# Patient Record
Sex: Female | Born: 1970 | Race: White | Hispanic: No | Marital: Married | Smoking: Never smoker
Health system: Southern US, Community
[De-identification: ages and names within clinical notes are randomized; demographics above are authoritative.]

## PROBLEM LIST (undated history)

## (undated) DIAGNOSIS — E119 Type 2 diabetes mellitus without complications: Secondary | ICD-10-CM

## (undated) DIAGNOSIS — T7840XA Allergy, unspecified, initial encounter: Secondary | ICD-10-CM

## (undated) DIAGNOSIS — E079 Disorder of thyroid, unspecified: Secondary | ICD-10-CM

## (undated) DIAGNOSIS — G43909 Migraine, unspecified, not intractable, without status migrainosus: Secondary | ICD-10-CM

## (undated) DIAGNOSIS — K219 Gastro-esophageal reflux disease without esophagitis: Secondary | ICD-10-CM

## (undated) HISTORY — DX: Disorder of thyroid, unspecified: E07.9

## (undated) HISTORY — DX: Allergy, unspecified, initial encounter: T78.40XA

## (undated) HISTORY — DX: Migraine, unspecified, not intractable, without status migrainosus: G43.909

## (undated) HISTORY — DX: Type 2 diabetes mellitus without complications: E11.9

## (undated) HISTORY — PX: OTHER SURGICAL HISTORY: SHX169

## (undated) HISTORY — PX: ABDOMINAL HYSTERECTOMY: SHX81

## (undated) HISTORY — PX: KNEE ARTHROSCOPY: SUR90

## (undated) HISTORY — DX: Gastro-esophageal reflux disease without esophagitis: K21.9

## (undated) HISTORY — PX: BREAST SURGERY: SHX581

---

## 1999-02-23 ENCOUNTER — Other Ambulatory Visit: Admission: RE | Admit: 1999-02-23 | Discharge: 1999-02-23 | Payer: Self-pay | Admitting: Obstetrics and Gynecology

## 1999-06-01 ENCOUNTER — Other Ambulatory Visit: Admission: RE | Admit: 1999-06-01 | Discharge: 1999-06-01 | Payer: Self-pay | Admitting: Obstetrics and Gynecology

## 1999-06-24 ENCOUNTER — Other Ambulatory Visit: Admission: RE | Admit: 1999-06-24 | Discharge: 1999-06-24 | Payer: Self-pay | Admitting: Obstetrics and Gynecology

## 1999-06-24 ENCOUNTER — Encounter (INDEPENDENT_AMBULATORY_CARE_PROVIDER_SITE_OTHER): Payer: Self-pay

## 1999-09-01 ENCOUNTER — Other Ambulatory Visit: Admission: RE | Admit: 1999-09-01 | Discharge: 1999-09-01 | Payer: Self-pay | Admitting: Obstetrics and Gynecology

## 2000-02-09 ENCOUNTER — Other Ambulatory Visit: Admission: RE | Admit: 2000-02-09 | Discharge: 2000-02-09 | Payer: Self-pay | Admitting: Obstetrics and Gynecology

## 2002-04-03 ENCOUNTER — Other Ambulatory Visit: Admission: RE | Admit: 2002-04-03 | Discharge: 2002-04-03 | Payer: Self-pay | Admitting: Obstetrics and Gynecology

## 2002-10-15 ENCOUNTER — Other Ambulatory Visit: Admission: RE | Admit: 2002-10-15 | Discharge: 2002-10-15 | Payer: Self-pay | Admitting: Obstetrics and Gynecology

## 2003-08-09 ENCOUNTER — Other Ambulatory Visit: Admission: RE | Admit: 2003-08-09 | Discharge: 2003-08-09 | Payer: Self-pay | Admitting: Obstetrics and Gynecology

## 2004-01-24 ENCOUNTER — Inpatient Hospital Stay (HOSPITAL_COMMUNITY): Admission: AD | Admit: 2004-01-24 | Discharge: 2004-01-24 | Payer: Self-pay | Admitting: Obstetrics and Gynecology

## 2004-01-27 ENCOUNTER — Ambulatory Visit (HOSPITAL_COMMUNITY): Admission: RE | Admit: 2004-01-27 | Discharge: 2004-01-27 | Payer: Self-pay | Admitting: Obstetrics and Gynecology

## 2004-08-31 ENCOUNTER — Other Ambulatory Visit: Admission: RE | Admit: 2004-08-31 | Discharge: 2004-08-31 | Payer: Self-pay | Admitting: Obstetrics and Gynecology

## 2005-08-26 ENCOUNTER — Other Ambulatory Visit: Admission: RE | Admit: 2005-08-26 | Discharge: 2005-08-26 | Payer: Self-pay | Admitting: Obstetrics and Gynecology

## 2006-04-12 ENCOUNTER — Encounter: Admission: RE | Admit: 2006-04-12 | Discharge: 2006-04-12 | Payer: Self-pay | Admitting: Internal Medicine

## 2006-11-02 ENCOUNTER — Encounter: Admission: RE | Admit: 2006-11-02 | Discharge: 2006-11-02 | Payer: Self-pay | Admitting: Obstetrics and Gynecology

## 2006-11-15 ENCOUNTER — Encounter: Admission: RE | Admit: 2006-11-15 | Discharge: 2006-11-15 | Payer: Self-pay | Admitting: Obstetrics and Gynecology

## 2006-11-17 ENCOUNTER — Encounter: Admission: RE | Admit: 2006-11-17 | Discharge: 2006-11-17 | Payer: Self-pay | Admitting: Obstetrics and Gynecology

## 2006-11-17 ENCOUNTER — Encounter (INDEPENDENT_AMBULATORY_CARE_PROVIDER_SITE_OTHER): Payer: Self-pay | Admitting: Specialist

## 2007-01-26 ENCOUNTER — Encounter: Admission: RE | Admit: 2007-01-26 | Discharge: 2007-01-26 | Payer: Self-pay | Admitting: Internal Medicine

## 2007-07-17 ENCOUNTER — Encounter: Admission: RE | Admit: 2007-07-17 | Discharge: 2007-10-15 | Payer: Self-pay | Admitting: Internal Medicine

## 2008-02-12 ENCOUNTER — Encounter: Admission: RE | Admit: 2008-02-12 | Discharge: 2008-02-12 | Payer: Self-pay | Admitting: Endocrinology

## 2009-02-12 ENCOUNTER — Encounter: Admission: RE | Admit: 2009-02-12 | Discharge: 2009-02-12 | Payer: Self-pay | Admitting: Endocrinology

## 2010-10-26 LAB — SURGICAL PCR SCREEN
MRSA, PCR: NEGATIVE
Staphylococcus aureus: POSITIVE — AB

## 2010-10-28 LAB — COMPREHENSIVE METABOLIC PANEL
ALT: 26 U/L (ref 0–35)
AST: 24 U/L (ref 0–37)
Albumin: 4.3 g/dL (ref 3.5–5.2)
Alkaline Phosphatase: 48 U/L (ref 39–117)
BUN: 12 mg/dL (ref 6–23)
CO2: 27 mEq/L (ref 19–32)
Calcium: 9 mg/dL (ref 8.4–10.5)
Chloride: 102 mEq/L (ref 96–112)
Creatinine, Ser: 0.54 mg/dL (ref 0.4–1.2)
GFR calc Af Amer: >60 mL/min (ref >60)
GFR calc non Af Amer: >60 mL/min (ref >60)
Glucose, Bld: 188 mg/dL — ABNORMAL HIGH (ref 70–99)
Potassium: 3.8 mEq/L (ref 3.5–5.1)
Sodium: 135 mEq/L (ref 135–145)
Total Bilirubin: 0.3 mg/dL (ref 0.3–1.2)
Total Protein: 7.9 g/dL (ref 6.0–8.3)

## 2010-10-28 LAB — CBC
HCT: 37.9 % (ref 36.0–46.0)
Hemoglobin: 12.5 g/dL (ref 12.0–15.0)
MCH: 26.7 pg (ref 26.0–34.0)
MCHC: 33 g/dL (ref 30.0–36.0)
MCV: 81 fL (ref 78.0–100.0)
Platelets: 287 10*3/uL (ref 150–400)
RBC: 4.68 MIL/uL (ref 3.87–5.11)
RDW: 13.9 % (ref 11.5–15.5)
WBC: 6.9 10*3/uL (ref 4.0–10.5)

## 2010-10-28 LAB — HEMOGLOBIN A1C
Hgb A1c MFr Bld: 6.2 % — ABNORMAL HIGH (ref <5.7)
Mean Plasma Glucose: 131 mg/dL — ABNORMAL HIGH (ref <117)

## 2010-10-28 LAB — TSH: TSH: 4.381 u[IU]/mL (ref 0.350–4.500)

## 2010-10-28 LAB — T4, FREE: Free T4: 1.37 ng/dL (ref 0.80–1.80)

## 2010-10-29 ENCOUNTER — Ambulatory Visit (HOSPITAL_COMMUNITY)
Admission: RE | Admit: 2010-10-29 | Discharge: 2010-10-30 | Payer: Self-pay | Source: Home / Self Care | Attending: Obstetrics and Gynecology | Admitting: Obstetrics and Gynecology

## 2010-10-29 ENCOUNTER — Encounter (INDEPENDENT_AMBULATORY_CARE_PROVIDER_SITE_OTHER): Payer: Self-pay | Admitting: Obstetrics and Gynecology

## 2010-11-01 ENCOUNTER — Encounter: Payer: Self-pay | Admitting: Internal Medicine

## 2010-11-01 NOTE — H&P (Signed)
Ashlee Dennis, Ashlee Dennis                   ACCOUNT NO.:  [id number]  MEDICAL RECORD NO.:  [id number]          PATIENT TYPE:  LOCATION:                                FACILITY:  [facility]  PHYSICIAN:  Huel Cote, M.D. DATE OF BIRTH:  02-Mar-1971  DATE OF ADMISSION:  10/29/2010 DATE OF DISCHARGE:                             HISTORY & PHYSICAL   The patient is a 40 year old nulligravida female who is coming in for a scheduled total laparoscopic hysterectomy.  This is being performed for several reasons.  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 She first had that present in 2009 and had a LEEP procedure performed, which did clear her Pap smear temporarily.  However, in 2010 she had another high-grade Pap smear and a repeat colposcopy confirmed that there was severe dysplasia still present.  She was counseled to undergo a cone procedure in the hospital, but did not wish to do this and opted for another LEEP procedure instead.  We performed a second LEEP procedure at her request in May of 2011 and that showed probable high- grade cell changes on that specimen as well.  Her repeat Pap smears after her LEEP continue to be consistent with high-grade changes.  At that time, she was counseled that she really needed to proceed with a conization or hysterectomy.  The patient had been considering hysterectomy in the past for a long history of menorrhagia.  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  We discussed the risks and benefits of all procedures involved multiple times and the patient elected to schedule a total laparoscopic hysterectomy.  We had initially scheduled this for the patient at the end of 2011, but she cancelled the procedure and rescheduled it at a later date, which is why there is some delay in performing the procedure.  PAST MEDICAL  HISTORY: 1. Significant for diabetes, which is diet controlled. 2. She also has a history of hypothyroidism, which is stable on     medications.  PAST SURGICAL HISTORY:  Significant for knee surgery and oral surgery.  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  FAMILY HISTORY:  Significant for breast cancer in one grandmother, renal cell cancer in a brother.  There is no colon cancer or heart disease.  CURRENT MEDICATIONS:  Include Synthroid only.  ALLERGIES:  She reports allergies to multiple medications.  These include penicillin, erythromycin, aspirin, metformin, and no other issues.  PHYSICAL EXAMINATION:  VITAL SIGNS:  The patient's current weight is 160, blood pressure is 128/80. CARDIAC:  Regular rate and rhythm. LUNGS:  Clear. ABDOMEN:  Soft and nontender. PELVIC:  Her uterus is enlarged, approximately 8 to 10 weeks.  Cervix is normal in appearance except for very short, almost flush with the vagina due to her prior procedures.  Cervix deviates to the patient's right somewhat.  At the time of surgery,  the patient reported that she was having only monthly cycles.  However, these lasted 8 to 9 days and were extremely heavy, as they had been for some time.  Her preoperative labs were performed and showed a hemoglobin that was good at 12.5.  Her glucose was slightly elevated at 131, but hemoglobin A1c was acceptable at 6.2.  Her TSH and free T4 were normal.  She was counseled as stated multiple times on her possible surgical procedures, including the TLH,  as well as other options including conization, myomectomy, etc.  After much discussion, the patient elects to proceed with a total laparoscopic hysterectomy.  She states she is comfortable with the fact that she will not child-bear herself.  She may consider some oocyte donation later this year in case  she should wish to do any surrogacy in the future.  We discussed the risks of surgery, including bleeding, infection, and possible damage to bowel and bladder. We also discussed that she could very well need an abdominal incision to complete the surgery, given that she has fibroids and a bicornuate uterus and this may make visualization difficult laparoscopically, especially in light of the fact that we need to remove her cervix in its entirety.  After full discussion, the patient is agreeable that she wishes to proceed with surgery and she will perform a bowel prep the evening prior to surgery and be n.p.o. after midnight.     Huel Cote, M.D.     KR/MEDQ  D:  10/28/2010  T:  10/28/2010  Job:  [id number]  Electronically Signed by Huel Cote M.D. on 11/01/2010 10:22:11 AM

## 2010-11-01 NOTE — Op Note (Signed)
NAMENOELLE, Ashlee Dennis                   ACCOUNT NO.:  [id number]  MEDICAL RECORD NO.:  [id number]          PATIENT TYPE:  OIB  LOCATION:  9304                          FACILITY:  [facility]  PHYSICIAN:  Huel Cote, M.D. DATE OF BIRTH:  Mar 05, 1971  DATE OF PROCEDURE:  10/29/2010 DATE OF DISCHARGE:                              OPERATIVE REPORT   PREOP DIAGNOSES: 1. Fibroid uterus. 2. Menorrhagia 3. Persistent cervical intraepithelial neoplasia 3 of cervix despite 2     loop electrocautery excision procedures.  POSTOPERATIVE DIAGNOSES: 1. Fibroid uterus. 2. Menorrhagia 3. Persistent cervical intraepithelial neoplasia 3 of cervix despite 2     loop electrocautery excision procedures.  PROCEDURE:  Laparoscopic hysterectomy with a minilaparotomy to remove the uterus and close the cuff.  SURGEON:  Huel Cote, MD  ASSISTANT:  Zenaida Niece, MD  Uterus was enlarged 8-10 weeks size, 458 g.  Ovaries and tubes were normal except for some adhesions bilaterally.  SPECIMENS:  The uterus and cervix were sent to Pathology.  FINDINGS:  Pending.  ESTIMATED BLOOD LOSS:  150 mL.  URINE OUTPUT:  300 mL, clear urine.  IV FLUIDS:  2200 mL, LR.  COMPLICATIONS:  There were no known complications, however, the vaginal ZUMI ring was removed through the abdomen as it was difficult to push back through the vagina and for this reason, the patient will receive an additional 2 doses of antibiotics to cover her for 24 hours.  DESCRIPTION OF PROCEDURE:  The patient was taken to the operating room where general anesthesia was obtained without difficulty.  She was then prepped and draped in the normal sterile fashion in the dorsal lithotomy position.  Once the abdomen was prepped, attention was then turned to the patient's vagina which was also prepped and a speculum placed within it.  The cervix was identified and grasped with a tenaculum.  The uterus itself was sounded approximately 8-9 cm.   Of note, the uterus was bicornuate by a previous ultrasound report.  The cervix was then sequentially dilated and a cervical ring was then pushed into the vagina to further extend possible and pushed around the cervix with a tenaculum grasping it.  The ZUMI apparatus was then introduced into the uterus and the balloon inflated with approximately 10 mL of normal saline.  Once this was performed, the tenaculum was removed as well as the speculum and the Foley catheter placed.  The vaginal insufflator was then put in place and of note, the entire patient's vaginal caliber was quite small and the ZUMI insufflator would not introduce all the way into the vagina but was sitting at the introitus.  With this then in place, attention was turned to the patient's abdomen where an infraumbilical incision was injected with 0.25% Marcaine and then made with scalpel.  This was approximately 1 cm in length.  The abdomen was elevated and the Veress needle was introduced without difficulty.  Intraperitoneal placement was confirmed by both aspiration and injection with normal saline.  Once this was completed, the pneumoperitoneum was obtained with approximately 2 liters of CO2 gas.  The Veress needle was  then removed.  In the direct view, OptiView 5-mm trocar placed within the umbilicus after we placed the patient in Trendelenburg.  Once this was performed, the uterus was noted to be approximately 10 weeks in size with probable bicornuate configuration, it was difficult to tell because uterus was quite distorted with a large fibroids.  The largest appeared to be approximately 6 cm in diameter.  The uterus was also quite immobile and did not retract very well with the RUMI apparatus.  The two additional ports were placed.  A 10/11 on the patient's right and 5 on the patient's left, each of these were injected with 0.25% Marcaine and the trocars placed under direct visualization.  Once these were in  place, the blunt probe was utilized to push the bowel caudad and the uterus was grasped with the larger tenaculum through the right port.  This was reflected medially as could best be performed.  The entire uterus was distorted with the uteroovarian ligaments very posterior and the round ligament almost at the area where the normal cornua would be. Dissection was begun with harmonic scalpel and was carried through the round ligament and with the fibroid that was visible being hugged this and the broad ligament were taken down.  Once this was loosened, the uterus was lifted anteriorly and the uteroovarian ligament was then visible.  This was then taken down with the harmonic scalpel as well. The remainder of this was taken down to the level of the uterine artery which was carefully skeletonized.  It should be noted that the fibroids had created some distortion at the bladder flap area with the peritoneum tenting over the fibroids and thus the peritoneal flap had to be opened to mobilize the uterus and expose the lower uterine segment and cervix. Once the uterine artery was transected, there was really no further visibility and no further dissection could be performed at that time. Attention was then turned to the patient's right where in a similar fashion, the uterus was deflected as could best be done medially.  The uteroovarian ligament and the broad ligament and the round ligament were taken down with the harmonic scalpel with some difficulty visualizing from time to time due to the bulky nature and the immobile nature of the uterus.  This was with some effort accomplished down to the level of the bladder flap that had been created on the other side.  It appeared that on the right side, there was a smaller horn to the uterus although this was not clearly delineated as the fibroids really butted it intimately and there was no true indentation between the 2 uterine bodies.  The uterine  artery was then secured on the patient's right with the harmonic scalpel.  At this point, the uterus was still not as mobile as one would desire.  The bladder flap was continued to be taken down and further dissection was performed to push the bladder off the lower uterine segment and the underlying cervix.  The bladder was pushed down and then with some elevation of the RUMI, the vaginal cuff was entered over the RUMI ring.  Once the vaginal cuff was entered, this was carried through and the vaginal cuff and cervix were completely detached from one another using the harmonic scalpel to circumferentially go around the cuff.  At this point, the uterus was completely detached.  It should be noted that the vaginal ring was pushed so deep into the vagina that it was almost protruding through the  vaginal cuff into the abdominal cavity.  At this point, gentle traction was placed on the uterus which was obviously much too bulky to fit through the vaginal opening to be delivered vaginally.  Options were discussed and we attempted to place the laparoscopic morcellator in the right port.  This was then utilized to morcellate the uterus fibroids that were visible and hopes that the uterus could be debulked enough to remove vaginally.  Because of the very poor mobility of the uterus, it would not lift up to the level of the morcellator with the RUMI still in it and pneumoperitoneum could not be obtained without removing the RUMI.  This was attempted for approximately 10 minutes and when the fibroid was not morcellating well, the decision was made to stop and perform a minilaparotomy to remove the uterus and cervix.  The instruments and pneumoperitoneum was then discontinued from the laparoscopic site and a very small minilaparotomy incision was performed just above the symphysis pubis approximately 7-cm wide.  Once this was opened, it was taken down to the level of the fascia by sharp dissection and  Bovie cautery.  The fascia was then nicked in the midline and the incision was extended laterally with Mayo scissors.  The inferior aspect was grasped with Kocher clamps and dissected off the rectus muscles.  The superior aspect was likewise dissected off the rectus muscles.  These were then separated in the midline and the peritoneal cavity entered bluntly.  With the peritoneal cavity was entered, the bowel was packed away with moist laparotomy sponges and the uterine fundus was grasped with Kocher clamps and elevated up to the level of the incision.  It was still quite bulky and large to fit through the incision.  Therefore, morcellation of the largest fibroid was performed with Bovie cautery and Mayo scissors. Once this fibroid was removed, the uterus was able to be reduced and removed through the minilaparotomy incision and the cervix in its entirety as well.  This was then handed off to Pathology and the vaginal cuff was then grasped in Kocher clamps and elevated up towards the incision.  This was then closed with several interrupted sutures of 0 Vicryl and figure-of-eight stitches all the way across the cuff until complete closure was noted.  There was excellent hemostasis and one additional suture was placed on the patient's left where the uterosacral ligament was somewhat prominent.  This was placed through and through the cuff for additional support and secured the uterosacral ligament to the cuff.  Once this was completed, all sutures were trimmed.  The pelvis was irrigated and all pedicles were inspected.  There was no active bleeding noted.  The ovaries appeared dry.  The sidewalls were visualized and the ureters were visible bilaterally and appeared to be peristalsing normally and normal in caliber.  Once the areas were inspected, all instruments and sponges were removed from the vagina.  A small Alexis retractor had been used for the removal of the uterus and the cuff  closure.  Just prior to the cuff closure, the vaginal ring was attempted to be pushed back down into the vagina for removal, however, it was not reducing to the vagina well, so it was grasped with a ring forceps and carefully removed through the abdominal incision.  Again, this was performed just prior to cuff closure and for this reason, the patient will be given an additional 2 doses of antibiotics.  The instruments and sponges all removed from the abdomen.  The  rectus muscles and peritoneum were reapproximated with several interrupted mattress sutures of 2-0 Vicryl.  The fascia was closed with 0 Vicryl in a running fashion.  The subcutaneous tissue was reapproximated with 2-0 chromic and the skin was closed with 4-0 Vicryl in a subcuticular stitch on a Keith needle.  The port sites were secured.  The larger port site on the right was secured with a deep 0 Vicryl suture and the skin incisions were then closed with 3-0 Vicryl in a subcuticular stitch at all port sites.  Dermabond was also placed over all incision sites.  The patient was then awakened and taken to the recovery room in good condition.  All sponge, lap, and needle counts were correct x2.     Huel Cote, M.D.     KR/MEDQ  D:  10/29/2010  T:  10/30/2010  Job:  [id number]  Electronically Signed by Huel Cote M.D. on 11/01/2010 10:22:15 AM

## 2010-11-02 LAB — TYPE AND SCREEN
ABO/RH(D): A POS
Antibody Screen: NEGATIVE

## 2010-11-02 LAB — GLUCOSE, CAPILLARY
Glucose-Capillary: 123 mg/dL — ABNORMAL HIGH (ref 70–99)
Glucose-Capillary: 144 mg/dL — ABNORMAL HIGH (ref 70–99)
Glucose-Capillary: 152 mg/dL — ABNORMAL HIGH (ref 70–99)
Glucose-Capillary: 109 mg/dL — ABNORMAL HIGH (ref 70–99)
Glucose-Capillary: 136 mg/dL — ABNORMAL HIGH (ref 70–99)

## 2010-11-02 LAB — CBC
HCT: 30.4 % — ABNORMAL LOW (ref 36.0–46.0)
Hemoglobin: 9.9 g/dL — ABNORMAL LOW (ref 12.0–15.0)
MCH: 26.5 pg (ref 26.0–34.0)
MCHC: 32.6 g/dL (ref 30.0–36.0)
MCV: 81.5 fL (ref 78.0–100.0)
Platelets: 228 10*3/uL (ref 150–400)
RBC: 3.73 MIL/uL — ABNORMAL LOW (ref 3.87–5.11)
RDW: 13.9 % (ref 11.5–15.5)
WBC: 10 10*3/uL (ref 4.0–10.5)

## 2010-11-02 LAB — ABO/RH: ABO/RH(D): A POS

## 2011-05-05 ENCOUNTER — Emergency Department (HOSPITAL_COMMUNITY): Payer: BC Managed Care – PPO

## 2011-05-05 ENCOUNTER — Emergency Department (HOSPITAL_COMMUNITY)
Admission: EM | Admit: 2011-05-05 | Discharge: 2011-05-06 | Disposition: A | Discharge status: Home / Self Care | Payer: BC Managed Care – PPO | Attending: Emergency Medicine | Admitting: Emergency Medicine

## 2011-05-05 DIAGNOSIS — IMO0002 Reserved for concepts with insufficient information to code with codable children: Secondary | ICD-10-CM | POA: Insufficient documentation

## 2011-05-05 DIAGNOSIS — Z794 Long term (current) use of insulin: Secondary | ICD-10-CM | POA: Insufficient documentation

## 2011-05-05 DIAGNOSIS — S01501A Unspecified open wound of lip, initial encounter: Secondary | ICD-10-CM | POA: Insufficient documentation

## 2011-05-05 DIAGNOSIS — E119 Type 2 diabetes mellitus without complications: Secondary | ICD-10-CM | POA: Insufficient documentation

## 2011-05-05 DIAGNOSIS — Y92838 Other recreation area as the place of occurrence of the external cause: Secondary | ICD-10-CM | POA: Insufficient documentation

## 2011-05-05 DIAGNOSIS — Y9239 Other specified sports and athletic area as the place of occurrence of the external cause: Secondary | ICD-10-CM | POA: Insufficient documentation

## 2011-05-05 DIAGNOSIS — S0003XA Contusion of scalp, initial encounter: Secondary | ICD-10-CM | POA: Insufficient documentation

## 2011-05-05 DIAGNOSIS — K1370 Unspecified lesions of oral mucosa: Secondary | ICD-10-CM | POA: Insufficient documentation

## 2011-07-13 ENCOUNTER — Other Ambulatory Visit: Payer: Self-pay | Admitting: Endocrinology

## 2011-07-13 DIAGNOSIS — E049 Nontoxic goiter, unspecified: Secondary | ICD-10-CM

## 2011-07-14 ENCOUNTER — Ambulatory Visit
Admission: RE | Admit: 2011-07-14 | Discharge: 2011-07-14 | Disposition: A | Discharge status: Home / Self Care | Payer: BC Managed Care – PPO | Source: Ambulatory Visit | Attending: Endocrinology | Admitting: Endocrinology

## 2011-07-14 DIAGNOSIS — E049 Nontoxic goiter, unspecified: Secondary | ICD-10-CM

## 2011-08-02 ENCOUNTER — Encounter: Payer: BC Managed Care – PPO | Admitting: Physical Therapy

## 2011-08-03 ENCOUNTER — Ambulatory Visit: Payer: BC Managed Care – PPO | Attending: Neurology | Admitting: Physical Therapy

## 2011-08-03 DIAGNOSIS — R269 Unspecified abnormalities of gait and mobility: Secondary | ICD-10-CM | POA: Insufficient documentation

## 2011-08-03 DIAGNOSIS — IMO0001 Reserved for inherently not codable concepts without codable children: Secondary | ICD-10-CM | POA: Insufficient documentation

## 2011-08-03 DIAGNOSIS — H8110 Benign paroxysmal vertigo, unspecified ear: Secondary | ICD-10-CM | POA: Insufficient documentation

## 2011-08-09 ENCOUNTER — Ambulatory Visit: Payer: BC Managed Care – PPO | Admitting: Physical Therapy

## 2011-08-16 ENCOUNTER — Ambulatory Visit: Payer: BC Managed Care – PPO | Attending: Neurology | Admitting: Physical Therapy

## 2011-08-16 DIAGNOSIS — IMO0001 Reserved for inherently not codable concepts without codable children: Secondary | ICD-10-CM | POA: Insufficient documentation

## 2011-08-16 DIAGNOSIS — H8110 Benign paroxysmal vertigo, unspecified ear: Secondary | ICD-10-CM | POA: Insufficient documentation

## 2011-08-16 DIAGNOSIS — R269 Unspecified abnormalities of gait and mobility: Secondary | ICD-10-CM | POA: Insufficient documentation

## 2011-08-25 ENCOUNTER — Ambulatory Visit: Payer: BC Managed Care – PPO | Admitting: Physical Therapy

## 2011-09-07 ENCOUNTER — Other Ambulatory Visit: Payer: Self-pay | Admitting: Otolaryngology

## 2011-09-07 DIAGNOSIS — R439 Unspecified disturbances of smell and taste: Secondary | ICD-10-CM

## 2011-09-15 ENCOUNTER — Ambulatory Visit
Admission: RE | Admit: 2011-09-15 | Discharge: 2011-09-15 | Disposition: A | Discharge status: Home / Self Care | Payer: BC Managed Care – PPO | Source: Ambulatory Visit | Attending: Otolaryngology | Admitting: Otolaryngology

## 2011-09-15 DIAGNOSIS — R439 Unspecified disturbances of smell and taste: Secondary | ICD-10-CM

## 2011-09-15 MED ORDER — GADOBENATE DIMEGLUMINE 529 MG/ML IV SOLN
14.0000 mL | Freq: Once | INTRAVENOUS | Status: AC | PRN
  Administered 2011-09-15: 14 mL via INTRAVENOUS

## 2011-10-15 ENCOUNTER — Ambulatory Visit (INDEPENDENT_AMBULATORY_CARE_PROVIDER_SITE_OTHER): Payer: BC Managed Care – PPO

## 2011-10-15 DIAGNOSIS — J209 Acute bronchitis, unspecified: Secondary | ICD-10-CM

## 2011-10-15 DIAGNOSIS — R059 Cough, unspecified: Secondary | ICD-10-CM

## 2011-10-15 DIAGNOSIS — R05 Cough: Secondary | ICD-10-CM

## 2012-04-12 ENCOUNTER — Ambulatory Visit (INDEPENDENT_AMBULATORY_CARE_PROVIDER_SITE_OTHER): Payer: BC Managed Care – PPO | Admitting: Family Medicine

## 2012-04-12 VITALS — BP 133/81 | HR 79 | Temp 98.3°F | Resp 18 | Ht 64.0 in | Wt 154.0 lb

## 2012-04-12 DIAGNOSIS — S0993XA Unspecified injury of face, initial encounter: Secondary | ICD-10-CM

## 2012-04-12 DIAGNOSIS — S199XXA Unspecified injury of neck, initial encounter: Secondary | ICD-10-CM

## 2012-04-12 NOTE — Progress Notes (Signed)
Patient is here to followup on her visits from last year. She just wanted to show me her face down is fully healed up. He is wearing braces still, and has to get a dental implant.  Objective: Face looks great. He cannot hardly see the abnormalities. On palpation the upper lip still feels very scarred and thickened, but is not grossly apparent. She is still wearing braces.  Assessment: Status post facial trauma  Plan: Thank you for coming in to let me see it

## 2012-07-12 ENCOUNTER — Ambulatory Visit: Payer: BC Managed Care – PPO | Attending: Neurology | Admitting: Physical Therapy

## 2012-07-12 DIAGNOSIS — IMO0001 Reserved for inherently not codable concepts without codable children: Secondary | ICD-10-CM | POA: Insufficient documentation

## 2012-07-12 DIAGNOSIS — R42 Dizziness and giddiness: Secondary | ICD-10-CM | POA: Insufficient documentation

## 2012-08-16 ENCOUNTER — Other Ambulatory Visit: Payer: Self-pay | Admitting: Dermatology

## 2012-10-05 ENCOUNTER — Other Ambulatory Visit: Payer: Self-pay | Admitting: Physician Assistant

## 2012-10-05 ENCOUNTER — Ambulatory Visit: Payer: BC Managed Care – PPO | Admitting: Family Medicine

## 2012-10-05 VITALS — BP 104/68 | HR 98 | Temp 97.7°F | Resp 18 | Ht 64.5 in | Wt 151.8 lb

## 2012-10-05 DIAGNOSIS — E119 Type 2 diabetes mellitus without complications: Secondary | ICD-10-CM

## 2012-10-05 DIAGNOSIS — E039 Hypothyroidism, unspecified: Secondary | ICD-10-CM | POA: Insufficient documentation

## 2012-10-05 DIAGNOSIS — J40 Bronchitis, not specified as acute or chronic: Secondary | ICD-10-CM

## 2012-10-05 MED ORDER — AZITHROMYCIN 250 MG PO TABS: ORAL_TABLET | ORAL | Status: DC

## 2012-10-05 MED ORDER — PREDNISONE 20 MG PO TABS: ORAL_TABLET | ORAL | Status: DC

## 2012-10-05 NOTE — Progress Notes (Signed)
41 yo woman with 1 week of cough, no fever.  Recently had floors polyurethaned.  H/O exer. Induced asthma  Was hit with baseball at [employer] last July and incurred $12000 in dental reconstruction  Objective:  NAD Dentures Chest:  Few ronchi Neck supple  Assessment:  Bronchitis  Plan:   z pak hydromet 1. Bronchitis  azithromycin (ZITHROMAX Z-PAK) 250 MG tablet, predniSONE (DELTASONE) 20 MG tablet  2. Type 2 diabetes mellitus    3. Hypothyroidism

## 2013-01-18 ENCOUNTER — Other Ambulatory Visit: Payer: Self-pay | Admitting: Endocrinology

## 2013-01-18 DIAGNOSIS — E049 Nontoxic goiter, unspecified: Secondary | ICD-10-CM

## 2013-03-02 ENCOUNTER — Ambulatory Visit: Payer: PRIVATE HEALTH INSURANCE | Admitting: Physician Assistant

## 2013-03-02 VITALS — BP 120/68 | HR 102 | Temp 99.1°F | Resp 18 | Ht 63.75 in | Wt 154.6 lb

## 2013-03-02 DIAGNOSIS — J069 Acute upper respiratory infection, unspecified: Secondary | ICD-10-CM

## 2013-03-02 MED ORDER — GUAIFENESIN ER 1200 MG PO TB12: 1.0000 | ORAL_TABLET | Freq: Two times a day (BID) | ORAL | Status: DC | PRN

## 2013-03-02 MED ORDER — IPRATROPIUM BROMIDE 0.03 % NA SOLN: 2.0000 | Freq: Two times a day (BID) | NASAL | Status: DC

## 2013-03-02 MED ORDER — BENZONATATE 100 MG PO CAPS: 100.0000 mg | ORAL_CAPSULE | Freq: Three times a day (TID) | ORAL | Status: DC | PRN

## 2013-03-02 NOTE — Patient Instructions (Signed)
Get plenty of rest and drink at least 64 ounces of water daily. 

## 2013-03-02 NOTE — Progress Notes (Signed)
  Subjective:    Patient ID: Ashlee Dennis, female    DOB: January 13, 1971, 42 y.o.   MRN: [id number]  HPI This 42 y.o. female presents for evaluation of cough beginning yesterday.  Non-productive.  No fever, chills, GI/GU symptoms.  Denies nasal/sinus congestion and drainage, sore throat, ear discomfort.  Past medical history, surgical history, family history, social history and problem list reviewed.   Review of Systems As above.    Objective:   Physical Exam  Blood pressure 120/68, pulse 102, temperature 99.1 F (37.3 C), temperature source Oral, resp. rate 18, height 5' 3.75" (1.619 m), weight 154 lb 9.6 oz (70.126 kg), SpO2 99.00%. Body mass index is 26.75 kg/(m^2). Well-developed, well nourished WF who is awake, alert and oriented, in NAD. HEENT: [place]/AT, PERRL, EOMI.  Sclera and conjunctiva are clear.  EAC are patent, TMs are normal in appearance. Nasal mucosa is congested, light pink and moist. OP is clear. Neck: supple, non-tender, no lymphadenopathy, thyromegaly. Heart: RRR, no murmur Lungs: normal effort, CTA Extremities: no cyanosis, clubbing or edema. Skin: warm and dry without rash. Psychologic: good mood and appropriate affect, normal speech and behavior.     Assessment & Plan:  Viral URI with cough - Plan: benzonatate (TESSALON) 100 MG capsule, ipratropium (ATROVENT) 0.03 % nasal spray, Guaifenesin (MUCINEX MAXIMUM STRENGTH) 1200 MG TB12  Anticipatory guidance, supportive care, RTC PRN.  Fernande Bras, PA-C Physician Assistant-Certified [facility]

## 2013-03-09 ENCOUNTER — Telehealth: Payer: Self-pay

## 2013-03-09 NOTE — Telephone Encounter (Signed)
She is not running a fever she is advised to go back on the Mucinex for another week.

## 2013-03-09 NOTE — Telephone Encounter (Signed)
Pt was seen the other day and she was wanting to know if she should still have her cough Call back numbers are [phone] and [phone]

## 2013-03-09 NOTE — Telephone Encounter (Signed)
Yes, she may still have a cough, called her, is she getting worse? Is she running a fever? Left message for her to call me back

## 2013-03-14 ENCOUNTER — Ambulatory Visit: Payer: PRIVATE HEALTH INSURANCE | Admitting: Family Medicine

## 2013-03-14 ENCOUNTER — Ambulatory Visit: Payer: PRIVATE HEALTH INSURANCE

## 2013-03-14 ENCOUNTER — Telehealth: Payer: Self-pay

## 2013-03-14 VITALS — BP 118/80 | HR 96 | Temp 99.3°F | Resp 16 | Ht 64.0 in | Wt 153.0 lb

## 2013-03-14 DIAGNOSIS — J209 Acute bronchitis, unspecified: Secondary | ICD-10-CM

## 2013-03-14 DIAGNOSIS — R059 Cough, unspecified: Secondary | ICD-10-CM

## 2013-03-14 DIAGNOSIS — R05 Cough: Secondary | ICD-10-CM

## 2013-03-14 DIAGNOSIS — R229 Localized swelling, mass and lump, unspecified: Secondary | ICD-10-CM

## 2013-03-14 MED ORDER — LEVOFLOXACIN 500 MG PO TABS: 500.0000 mg | ORAL_TABLET | Freq: Every day | ORAL | Status: DC

## 2013-03-14 MED ORDER — BENZONATATE 100 MG PO CAPS: 100.0000 mg | ORAL_CAPSULE | Freq: Three times a day (TID) | ORAL | Status: DC | PRN

## 2013-03-14 NOTE — Patient Instructions (Signed)

## 2013-03-14 NOTE — Progress Notes (Signed)
[facility]:  Office Visit  Chief Complaint:  Chief Complaint  Patient presents with  . Follow-up    Cough    HPI: Ashlee Dennis is a 42 y.o. female who complains of 11 day history of cough. SOB, wheeze, Is not bringing up anything. She has had fevers, chills. Has been here and was given flonase, mucinex, and atrovent NS for viral infection, She is a Corporate investment banker. + ear pressure, facial pressure. She is sick and tried of coughing. She can't take dextromorphan due to hives, so has been only on mucinex and cough lozenges. Feels tired. .   Past Medical History  Diagnosis Date  . Allergy   . GERD (gastroesophageal reflux disease)   . Diabetes mellitus without complication   . Thyroid disease    Past Surgical History  Procedure Laterality Date  . Knee arthroscopy    . Abdominal hysterectomy    . Breast surgery      Cancer   History   Social History  . Marital Status: Single    Spouse Name: N/A    Number of Children: 0  . Years of Education: college   Occupational History  . substitute teacher    Social History Main Topics  . Smoking status: Never Smoker   . Smokeless tobacco: None  . Alcohol Use: No  . Drug Use: No  . Sexually Active: Yes -- Female partner(s)    Birth Control/ Protection: Condom   Other Topics Concern  . None   Social History Narrative   Lives with her boyfriend.   Family History  Problem Relation Age of Onset  . Cancer Brother 30    renal cell carcinoma  . Thyroid disease Sister   . Thyroid disease Sister    Allergies  Allergen Reactions  . Asa (Aspirin) Other (See Comments)    GERD  . Codeine Hives  . Dextroamphetamine Hives  . Erythromycin Hives  . Oxycodone Hives  . Penicillins Hives   Prior to Admission medications   Medication Sig Start Date End Date Taking? Authorizing Provider  benzonatate (TESSALON) 100 MG capsule Take 1-2 capsules (100-200 mg total) by mouth 3 (three) times daily as  needed for cough. 03/02/13  Yes Chelle S Jeffery, PA-C  Guaifenesin (MUCINEX MAXIMUM STRENGTH) 1200 MG TB12 Take 1 tablet (1,200 mg total) by mouth every 12 (twelve) hours as needed. 03/02/13  Yes Chelle S Jeffery, PA-C  ipratropium (ATROVENT) 0.03 % nasal spray Place 2 sprays into the nose 2 (two) times daily. 03/02/13  Yes Chelle S Jeffery, PA-C  levothyroxine (SYNTHROID, LEVOTHROID) 75 MCG tablet Take 75 mcg by mouth daily.   Yes Historical Provider, MD     ROS: The patient denies fevers, chills, night sweats, unintentional weight loss, chest pain, palpitations, wheezing, dyspnea on exertion, nausea, vomiting, abdominal pain, dysuria, hematuria, melena, numbness, or tingling.  All other systems have been reviewed and were otherwise negative with the exception of those mentioned in the HPI and as above.    PHYSICAL EXAM: Filed Vitals:   03/14/13 1337  BP: 118/80  Pulse: 96  Temp: 99.3 F (37.4 C)  Resp: 16   Filed Vitals:   03/14/13 1337  Height: 5\' 4"  (1.626 m)  Weight: 153 lb (69.4 kg)   Body mass index is 26.25 kg/(m^2).  General: Alert, no acute distress, tired appearing HEENT:  Normocephalic, atraumatic, oropharynx patent. Tm nl. Boggy nose, mild sinus tenderness, no exudates, throat is erythematous an raw Cardiovascular:  Regular rate and rhythm, no rubs murmurs or gallops.  No Carotid bruits, radial pulse intact. No pedal edema.  Respiratory: Clear to auscultation bilaterally.  No wheezes, rales,  +rhonchi.  No cyanosis, no use of accessory musculature GI: No organomegaly, abdomen is soft and non-tender, positive bowel sounds.  No masses. Skin: No rashes. Neurologic: Facial musculature symmetric. Psychiatric: Patient is appropriate throughout our interaction. Lymphatic: No cervical lymphadenopathy Musculoskeletal: Gait intact.   LABS:    EKG/XRAY:   Primary read interpreted by Dr. Conley Rolls at [facility]. Bronchitic changes with increase vascular markings, less likely infiltrate  when compared to 06/2012 xray unchanged.    ASSESSMENT/PLAN: Encounter Diagnoses  Name Primary?  . Cough   . Acute bronchitis Yes   Rx Levaquin C/w mucinex, throat lozenges, albuterol NS, tessalone perles Patient has albuterol inhaler at home Gross sideeffects, risk and benefits, and alternatives of medications d/w patient. Patient is aware that all medications have potential sideeffects and we are unable to predict every sideeffect or drug-drug interaction that may occur. F/u prn    Kasey Hansell PHUONG, DO 03/14/2013 2:38 PM

## 2013-03-14 NOTE — Telephone Encounter (Signed)
Patient called to ask if Chelle could rx something for her cough since it has been over a week since she saw her and still no improvement; also reports being very tired from all the coughing. Please advise if she needs an ov or if someone can rx something to her pharmacy.   Best: [phone]

## 2013-03-14 NOTE — Telephone Encounter (Signed)
I would expect her to be well/ better now. She should return for recheck. She may need CXR and/ or labs. She is advised and will come back.

## 2013-03-20 ENCOUNTER — Ambulatory Visit
Admission: RE | Admit: 2013-03-20 | Discharge: 2013-03-20 | Disposition: A | Discharge status: Home / Self Care | Payer: PRIVATE HEALTH INSURANCE | Source: Ambulatory Visit | Attending: Endocrinology | Admitting: Endocrinology

## 2013-03-20 ENCOUNTER — Other Ambulatory Visit: Payer: BC Managed Care – PPO

## 2013-03-20 DIAGNOSIS — E049 Nontoxic goiter, unspecified: Secondary | ICD-10-CM

## 2013-04-09 ENCOUNTER — Ambulatory Visit (INDEPENDENT_AMBULATORY_CARE_PROVIDER_SITE_OTHER): Payer: PRIVATE HEALTH INSURANCE | Admitting: Psychology

## 2013-04-09 DIAGNOSIS — F411 Generalized anxiety disorder: Secondary | ICD-10-CM

## 2013-04-09 DIAGNOSIS — F4310 Post-traumatic stress disorder, unspecified: Secondary | ICD-10-CM

## 2013-04-24 ENCOUNTER — Ambulatory Visit (INDEPENDENT_AMBULATORY_CARE_PROVIDER_SITE_OTHER): Payer: PRIVATE HEALTH INSURANCE | Admitting: Psychology

## 2013-04-24 DIAGNOSIS — F411 Generalized anxiety disorder: Secondary | ICD-10-CM

## 2013-04-24 DIAGNOSIS — F4310 Post-traumatic stress disorder, unspecified: Secondary | ICD-10-CM

## 2013-05-07 ENCOUNTER — Ambulatory Visit: Payer: PRIVATE HEALTH INSURANCE | Admitting: Psychology

## 2013-05-08 ENCOUNTER — Ambulatory Visit (INDEPENDENT_AMBULATORY_CARE_PROVIDER_SITE_OTHER): Payer: PRIVATE HEALTH INSURANCE | Admitting: Psychology

## 2013-05-08 DIAGNOSIS — F411 Generalized anxiety disorder: Secondary | ICD-10-CM

## 2013-05-08 DIAGNOSIS — F4310 Post-traumatic stress disorder, unspecified: Secondary | ICD-10-CM

## 2013-06-28 ENCOUNTER — Ambulatory Visit (INDEPENDENT_AMBULATORY_CARE_PROVIDER_SITE_OTHER): Payer: PRIVATE HEALTH INSURANCE | Admitting: Psychology

## 2013-06-28 DIAGNOSIS — F4310 Post-traumatic stress disorder, unspecified: Secondary | ICD-10-CM

## 2013-06-28 DIAGNOSIS — F411 Generalized anxiety disorder: Secondary | ICD-10-CM

## 2013-07-17 ENCOUNTER — Ambulatory Visit: Payer: PRIVATE HEALTH INSURANCE | Admitting: Audiology

## 2013-08-14 ENCOUNTER — Ambulatory Visit: Payer: PRIVATE HEALTH INSURANCE | Admitting: Psychology

## 2013-08-20 ENCOUNTER — Ambulatory Visit (INDEPENDENT_AMBULATORY_CARE_PROVIDER_SITE_OTHER): Payer: PRIVATE HEALTH INSURANCE | Admitting: Psychology

## 2013-08-20 DIAGNOSIS — F4310 Post-traumatic stress disorder, unspecified: Secondary | ICD-10-CM

## 2013-08-20 DIAGNOSIS — F411 Generalized anxiety disorder: Secondary | ICD-10-CM

## 2013-09-03 ENCOUNTER — Ambulatory Visit: Payer: PRIVATE HEALTH INSURANCE

## 2013-09-03 ENCOUNTER — Ambulatory Visit: Payer: PRIVATE HEALTH INSURANCE | Admitting: Emergency Medicine

## 2013-09-03 VITALS — BP 108/70 | HR 101 | Temp 98.7°F | Resp 16 | Ht 65.0 in | Wt 159.0 lb

## 2013-09-03 DIAGNOSIS — J3489 Other specified disorders of nose and nasal sinuses: Secondary | ICD-10-CM

## 2013-09-03 DIAGNOSIS — J309 Allergic rhinitis, unspecified: Secondary | ICD-10-CM

## 2013-09-03 DIAGNOSIS — R05 Cough: Secondary | ICD-10-CM

## 2013-09-03 DIAGNOSIS — J302 Other seasonal allergic rhinitis: Secondary | ICD-10-CM

## 2013-09-03 DIAGNOSIS — R059 Cough, unspecified: Secondary | ICD-10-CM

## 2013-09-03 MED ORDER — BENZONATATE 100 MG PO CAPS: 100.0000 mg | ORAL_CAPSULE | Freq: Three times a day (TID) | ORAL | Status: DC | PRN

## 2013-09-03 NOTE — Progress Notes (Signed)
  This chart was scribed for Ashlee Chris, MD by Joaquin Music, ED Scribe. This patient was seen in room Room/bed 4 and the patient's care was started at 8:42 AM. Subjective:    Patient ID: Ashlee Dennis, female    DOB: 1971-02-21, 42 y.o.   MRN: [id number] Chief Complaint  Patient presents with  . Cough    X 2 days  . Sinus Congestion    X 1 week   HPI Ashlee Dennis is a 42 y.o. female who presents to the [facility] complaining of ongoing worsening cough and sinus congestion with associated cough and no sputum for the past week. Pt states her sinus congestion is dry and feels her "nose is dry". She states she has these symtomns yearly due to ragweed. Pt states she has been sneezing. Pt denies having sputum. Pt states she has been taking Alegra, Patnase, and saline sprays with minimal relief.  Pt denies otalgia, sore throat, and fever.  She states she was hit by a baseball 2 years (July 2012) ago and believes her sinuses have not been the same since.  Review of Systems  Constitutional: Negative for fever.  HENT: Positive for congestion, sinus pressure and sneezing. Negative for ear pain and sore throat.   Respiratory: Positive for cough.   All other systems reviewed and are negative.   Objective:   Physical Exam CONSTITUTIONAL: Well developed/well nourished HEAD: Normocephalic/atraumatic EYES: EOMI/PERRL ENMT: Mucous membranes moist NECK: supple no meningeal signs SPINE:entire spine nontender CV: S1/S2 noted, no murmurs/rubs/gallops noted LUNGS: Lungs are clear to auscultation bilaterally, no apparent distress ABDOMEN: soft, nontender, no rebound or guarding GU:no cva tenderness NEURO: Pt is awake/alert, moves all extremitiesx4 EXTREMITIES: pulses normal, full ROM SKIN: warm, color normal PSYCH: no abnormalities of mood noted [facility] reading (PRIMARY) by  Dr.Murna Backer there is no evidence of sinusitis .  Triage Vitals:BP 108/70  Pulse 101  Temp(Src) 98.7 F  (37.1 C) (Oral)  Resp 16  Ht 5\' 5"  (1.651 m)  Wt 159 lb (72.122 kg)  BMI 26.46 kg/m2  SpO2 99% Assessment & Plan:  Have advised her to use saline gel to the inside of her nose and gave her Tessalon Perles to use. I also advised her to use her netty pot  I personally performed the services described in this documentation, which was scribed in my presence. The recorded information has been reviewed and is accurate.

## 2013-09-03 NOTE — Patient Instructions (Signed)
Please use saline gel to the inside of your nose 4 times a day. Please consider using a Netty  pot at home as  this will help with the nasal dryness

## 2013-11-20 ENCOUNTER — Ambulatory Visit: Payer: PRIVATE HEALTH INSURANCE | Admitting: Family Medicine

## 2013-11-20 VITALS — BP 122/70 | HR 96 | Temp 97.9°F | Resp 18 | Ht 65.0 in | Wt 157.0 lb

## 2013-11-20 DIAGNOSIS — R059 Cough, unspecified: Secondary | ICD-10-CM

## 2013-11-20 DIAGNOSIS — R05 Cough: Secondary | ICD-10-CM

## 2013-11-20 DIAGNOSIS — J069 Acute upper respiratory infection, unspecified: Secondary | ICD-10-CM

## 2013-11-20 MED ORDER — BENZONATATE 100 MG PO CAPS: 100.0000 mg | ORAL_CAPSULE | Freq: Three times a day (TID) | ORAL | Status: DC | PRN

## 2013-11-20 NOTE — Progress Notes (Signed)
[facility] [address], [place] [address] [phone]  Date:  11/20/2013   Name:  Ashlee Dennis   DOB:  1971/03/08   MRN:  [id number]  PCP:  Katy Apo, MD    Chief Complaint: Cough and Nasal Congestion   History of Present Illness:  Ashlee Dennis is a 43 y.o. very pleasant female patient who presents with the following:  She is here today with a cough, sneeze, and stuffy nose for 3 or 4 days. She has not noted a fever.  The cough is dry.   No body aches or chills, no Gi symptoms No sick contacts that she knows of.   She has tried some Careers adviser and an rx nasal spray from her allergist.    She has a history of DM 2- diet controlled  Her PCP is Dr. Nehemiah Settle- per her report her DM has been well controlled as of late.   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 Type 2 diabetes mellitus 10/05/2012  . Hypothyroidism 10/05/2012    Past Medical History  Diagnosis Date  . Allergy   . GERD (gastroesophageal reflux disease)   . Diabetes mellitus without complication   . Thyroid disease     Past Surgical History  Procedure Laterality Date  . Knee arthroscopy    . Abdominal hysterectomy    . Breast surgery      Cancer    History  Substance Use Topics  . Smoking status: Never Smoker   . Smokeless tobacco: Not on file  . Alcohol Use: No    Family History  Problem Relation Age of Onset  . Cancer Brother 30    renal cell carcinoma  . Thyroid disease Sister   . Thyroid disease Sister     Allergies  Allergen Reactions  . Asa [Aspirin] Other (See Comments)    GERD  . Codeine Hives  . Dextroamphetamine Hives  . Erythromycin Hives  . Oxycodone Hives  . Penicillins Hives    Medication list has been reviewed and updated.  Current Outpatient Prescriptions on File Prior to Visit  Medication Sig Dispense Refill  . ipratropium (ATROVENT) 0.03 % nasal  spray Place 2 sprays into the nose 2 (two) times daily.  30 mL  0  . levothyroxine (SYNTHROID, LEVOTHROID) 75 MCG tablet Take 75 mcg by mouth daily.      . benzonatate (TESSALON) 100 MG capsule Take 1-2 capsules (100-200 mg total) by mouth 3 (three) times daily as needed for cough.  40 capsule  1  . Guaifenesin (MUCINEX MAXIMUM STRENGTH) 1200 MG TB12 Take 1 tablet (1,200 mg total) by mouth every 12 (twelve) hours as needed.  14 tablet  1  . levofloxacin (LEVAQUIN) 500 MG tablet Take 1 tablet (500 mg total) by mouth daily.  7 tablet  0   No current facility-administered medications on file prior to visit.    Review of Systems:  As per HPI- otherwise negative. No SOB, no CP, no hemoptysis,no histoyr of DVT or PE, no hormones, no tobacco Looking back in the chart her pulse is often 100 or more- admits she does get a little nervous at MD office   Physical Examination: Filed Vitals:   11/20/13 0741  BP: 122/70  Pulse: 106  Temp: 97.9 F (36.6 C)  Resp: 18   Filed Vitals:   11/20/13 0741  Height:  5\' 5"  (1.651 m)  Weight: 157 lb (71.215 kg)   Body mass index is 26.13 kg/(m^2). Ideal Body Weight: Weight in (lb) to have BMI = 25: 149.9  GEN: WDWN, NAD, Non-toxic, A & O x 3, looks well HEENT: Atraumatic, Normocephalic. Neck supple. No masses, No LAD.  Bilateral TM wnl, oropharynx normal.  PEERL,EOMI.   Ears and Nose: No external deformity. CV: RRR, No M/G/R. No JVD. No thrill. No extra heart sounds. PULM: CTA B, no wheezes, crackles, rhonchi. No retractions. No resp. distress. No accessory muscle use. ABD: S, NT, ND, +BS. No rebound. No HSM. EXTR: No c/c/e NEURO Normal gait.  PSYCH: Normally interactive. Conversant. Not depressed or anxious appearing.  Calm demeanor.  No calf swelling or tenderness   Assessment and Plan: Cough - Plan: benzonatate (TESSALON) 100 MG capsule  Viral URI  Likely viral URI.  Tessalon and OTC medications as needed  cautioned that she does have mild  tachycardia- if any SOB or CP she needs to seek help.  She agrees to do so  Signed Abbe AmsterdamJessica Juan Olthoff, MD

## 2013-11-20 NOTE — Patient Instructions (Signed)
I think you likely have a viral upper respiratory infection ( a "cold). I expect you will feel better in the next few days. Try tessalon perles as needed for cough, and you can use your other medications such as allegra and nasal spray as needed  If you are not feeling better soon please let me know.

## 2013-12-12 ENCOUNTER — Ambulatory Visit: Payer: PRIVATE HEALTH INSURANCE | Admitting: Psychology

## 2015-01-22 ENCOUNTER — Ambulatory Visit (INDEPENDENT_AMBULATORY_CARE_PROVIDER_SITE_OTHER): Payer: BLUE CROSS/BLUE SHIELD

## 2015-01-22 ENCOUNTER — Ambulatory Visit: Payer: BLUE CROSS/BLUE SHIELD

## 2015-01-22 ENCOUNTER — Ambulatory Visit (INDEPENDENT_AMBULATORY_CARE_PROVIDER_SITE_OTHER): Payer: BLUE CROSS/BLUE SHIELD | Admitting: Emergency Medicine

## 2015-01-22 VITALS — BP 118/80 | HR 70 | Temp 97.6°F | Resp 18 | Ht 65.0 in | Wt 155.4 lb

## 2015-01-22 DIAGNOSIS — R059 Cough, unspecified: Secondary | ICD-10-CM

## 2015-01-22 DIAGNOSIS — R05 Cough: Secondary | ICD-10-CM

## 2015-01-22 MED ORDER — MONTELUKAST SODIUM 10 MG PO TABS: 10.0000 mg | ORAL_TABLET | Freq: Every day | ORAL | Status: DC

## 2015-01-22 MED ORDER — BENZONATATE 100 MG PO CAPS: 100.0000 mg | ORAL_CAPSULE | Freq: Three times a day (TID) | ORAL | Status: DC | PRN

## 2015-01-22 MED ORDER — RANITIDINE HCL 150 MG PO TABS: 150.0000 mg | ORAL_TABLET | Freq: Two times a day (BID) | ORAL | Status: DC

## 2015-01-22 NOTE — Progress Notes (Signed)
Subjective:  This chart was scribed for Lesle Chris MD, by Veverly Fells, at [facility].  This patient was seen in room 1 and the patient's care was started at 8:21 AM.    Patient ID: Ashlee Dennis, female    DOB: [birth year], 44 y.o.   MRN: [id number] Chief Complaint  Patient presents with  . Cough    x 4 days     Cough Pertinent negatives include no chills, fever, headaches, myalgias or sore throat.    HPI Comments: Ashlee Dennis is a 44 y.o. female who presents to [facility] for an intermittent dry cough onset four days ago.   Patient denies a fever, sore throat, headache, myalgia. Patient has never smoked.  Patient has a history of acid reflux disease since she was 44 years old but does not take any medication for it.  Patients last chest x-ray was in 2014 by Dr. Nedra Hai. Patient denies possible pregnancy.  She has no other symptoms today.     Patient Active Problem List   Diagnosis Date Noted  . Type 2 diabetes mellitus 10/05/2012  . Hypothyroidism 10/05/2012   Past Medical History  Diagnosis Date  . Allergy   . GERD (gastroesophageal reflux disease)   . Diabetes mellitus without complication   . Thyroid disease    Past Surgical History  Procedure Laterality Date  . Knee arthroscopy    . Abdominal hysterectomy    . Breast surgery      Cancer   Allergies  Allergen Reactions  . Asa [Aspirin] Other (See Comments)    GERD  . Codeine Hives  . Dextroamphetamine Hives  . Erythromycin Hives  . Oxycodone Hives  . Penicillins Hives   Prior to Admission medications   Medication Sig Start Date End Date Taking? Authorizing Provider  fexofenadine (ALLEGRA) 180 MG tablet Take 180 mg by mouth daily.   Yes Historical Provider, MD  levothyroxine (SYNTHROID, LEVOTHROID) 75 MCG tablet Take 75 mcg by mouth daily.   Yes Historical Provider, MD  benzonatate (TESSALON) 100 MG capsule Take 1-2 capsules (100-200 mg total) by mouth 3 (three) times  daily as needed for cough. Patient not taking: Reported on 01/22/2015 11/20/13   Pearline Cables, MD  Guaifenesin [facility] MAXIMUM STRENGTH) 1200 MG TB12 Take 1 tablet (1,200 mg total) by mouth every 12 (twelve) hours as needed. Patient not taking: Reported on 01/22/2015 03/02/13   Chelle S Jeffery, PA-C  ipratropium (ATROVENT) 0.03 % nasal spray Place 2 sprays into the nose 2 (two) times daily. Patient not taking: Reported on 01/22/2015 03/02/13   Fernande Bras, PA-C   History   Social History  . Marital Status: Single    Spouse Name: N/A  . Number of Children: 0  . Years of Education: college   Occupational History  . substitute teacher    Social History Main Topics  . Smoking status: Never Smoker   . Smokeless tobacco: Not on file  . Alcohol Use: No  . Drug Use: No  . Sexual Activity:    Partners: Male    Birth Control/ Protection: Condom   Other Topics Concern  . Not on file   Social History Narrative   Lives with her boyfriend.     Review of Systems  Constitutional: Negative for fever and chills.  HENT: Negative for nosebleeds and sore throat.   Eyes: Negative for discharge.  Respiratory: Positive for cough.   Musculoskeletal: Negative for myalgias.  Neurological:  Negative for syncope and headaches.       Objective:   Physical Exam  CONSTITUTIONAL: Well developed/well nourished HEAD: Normocephalic/atraumatic EYES: EOMI/PERRL ENMT: Mucous membranes moist, TMs normal, throat slightly red.  NECK: supple no meningeal signs SPINE/BACK:entire spine nontender CV: S1/S2 noted, no murmurs/rubs/gallops noted LUNGS: Lungs are clear to auscultation bilaterally, no apparent distress, no wheezes.  ABDOMEN: soft, nontender, no rebound or guarding, bowel sounds noted throughout abdomen GU:no cva tenderness NEURO: Pt is awake/alert/appropriate, moves all extremitiesx4.  No facial droop.   EXTREMITIES: pulses normal/equal, full ROM SKIN: warm, color normal PSYCH: no  abnormalities of mood noted, alert and oriented to situation  Filed Vitals:   01/22/15 0815  BP: 118/80  Pulse: 70  Temp: 97.6 F (36.4 C)  TempSrc: Oral  Resp: 18  Height: 5\' 5"  (1.651 m)  Weight: 155 lb 6.4 oz (70.489 kg)  SpO2: 98%     [facility] (PRIMARY) x-ray report read by Dr. Cleta Albertsaub: There is some atelectasis versus scarring at the right base. This appears unchanged from her x-ray 2014        Assessment & Plan:  Will refill her Tessalon Perles. She will continue Allegra once a day. I have added Singulair to take at night. She will also be on Zantac 150 twice a day to treat her reflux symptoms.I personally performed the services described in this documentation, which was scribed in my presence. The recorded information has been reviewed and is accurate.

## 2015-01-22 NOTE — Patient Instructions (Signed)
Cough, Adult  A cough is a reflex that helps clear your throat and airways. It can help heal the body or may be a reaction to an irritated airway. A cough may only last 2 or 3 weeks (acute) or may last more than 8 weeks (chronic).  CAUSES Acute cough:  Viral or bacterial infections. Chronic cough:  Infections.  Allergies.  Asthma.  Post-nasal drip.  Smoking.  Heartburn or acid reflux.  Some medicines.  Chronic lung problems (COPD).  Cancer. SYMPTOMS   Cough.  Fever.  Chest pain.  Increased breathing rate.  High-pitched whistling sound when breathing (wheezing).  Colored mucus that you cough up (sputum). TREATMENT   A bacterial cough may be treated with antibiotic medicine.  A viral cough must run its course and will not respond to antibiotics.  Your caregiver may recommend other treatments if you have a chronic cough. HOME CARE INSTRUCTIONS   Only take over-the-counter or prescription medicines for pain, discomfort, or fever as directed by your caregiver. Use cough suppressants only as directed by your caregiver.  Use a cold steam vaporizer or humidifier in your bedroom or home to help loosen secretions.  Sleep in a semi-upright position if your cough is worse at night.  Rest as needed.  Stop smoking if you smoke. SEEK IMMEDIATE MEDICAL CARE IF:   You have pus in your sputum.  Your cough starts to worsen.  You cannot control your cough with suppressants and are losing sleep.  You begin coughing up blood.  You have difficulty breathing.  You develop pain which is getting worse or is uncontrolled with medicine.  You have a fever. MAKE SURE YOU:   Understand these instructions.  Will watch your condition.  Will get help right away if you are not doing well or get worse. Document Released: 03/26/2011 Document Revised: 12/20/2011 Document Reviewed: 03/26/2011 ExitCare Patient Information 2015 [employer]. This information is not intended  to replace advice given to you by your health care provider. Make sure you discuss any questions you have with your health care provider.  

## 2015-03-24 ENCOUNTER — Ambulatory Visit (HOSPITAL_BASED_OUTPATIENT_CLINIC_OR_DEPARTMENT_OTHER)
Admission: RE | Admit: 2015-03-24 | Discharge: 2015-03-24 | Disposition: A | Discharge status: Home / Self Care | Payer: BLUE CROSS/BLUE SHIELD | Source: Ambulatory Visit | Attending: Family Medicine | Admitting: Family Medicine

## 2015-03-24 ENCOUNTER — Encounter (INDEPENDENT_AMBULATORY_CARE_PROVIDER_SITE_OTHER): Payer: Self-pay

## 2015-03-24 ENCOUNTER — Ambulatory Visit (INDEPENDENT_AMBULATORY_CARE_PROVIDER_SITE_OTHER): Payer: BLUE CROSS/BLUE SHIELD | Admitting: Family Medicine

## 2015-03-24 ENCOUNTER — Encounter: Payer: Self-pay | Admitting: Family Medicine

## 2015-03-24 VITALS — BP 157/81 | HR 97 | Ht 64.0 in | Wt 152.0 lb

## 2015-03-24 DIAGNOSIS — M7741 Metatarsalgia, right foot: Secondary | ICD-10-CM

## 2015-03-24 DIAGNOSIS — M79671 Pain in right foot: Secondary | ICD-10-CM | POA: Diagnosis not present

## 2015-03-24 DIAGNOSIS — M79674 Pain in right toe(s): Secondary | ICD-10-CM | POA: Diagnosis not present

## 2015-03-25 DIAGNOSIS — M7740 Metatarsalgia, unspecified foot: Secondary | ICD-10-CM | POA: Insufficient documentation

## 2015-03-25 NOTE — Assessment & Plan Note (Signed)
radiographs negative for a nonunion.  MSK u/s also negative - no evidence stress fracture.  Reassured patient.  Advised to try metatarsal pads with or without sports insoles - placed these in insoles of her current shoes and pain was improved.  Advised to try these over the next several weeks.  Consider custom orthotics in future.

## 2015-03-25 NOTE — Progress Notes (Signed)
PCP: Katy Apo, MD  Subjective:   HPI: Patient is a 44 y.o. female here for right toe pain.  Patient reports in July 2015 a dog stepped on her 2nd toe causing her to fracture this. Has had constant pain since that time. Some swelling. Pain medial side of this toe mainly. Also has hammering of this toe but was present before the injury. Initially was in a boot for 3 months then a postop shoe. Tried advil. Advised she would need surgery for this.  Past Medical History  Diagnosis Date  . Allergy   . GERD (gastroesophageal reflux disease)   . Diabetes mellitus without complication   . Thyroid disease     Current Outpatient Prescriptions on File Prior to Visit  Medication Sig Dispense Refill  . fexofenadine (ALLEGRA) 180 MG tablet Take 180 mg by mouth daily.    Marland Kitchen levothyroxine (SYNTHROID, LEVOTHROID) 75 MCG tablet Take 75 mcg by mouth daily.    . montelukast (SINGULAIR) 10 MG tablet Take 1 tablet (10 mg total) by mouth at bedtime. 30 tablet 3  . ranitidine (ZANTAC) 150 MG tablet Take 1 tablet (150 mg total) by mouth 2 (two) times daily. 60 tablet 3   No current facility-administered medications on file prior to visit.    Past Surgical History  Procedure Laterality Date  . Knee arthroscopy    . Abdominal hysterectomy    . Breast surgery      Cancer    Allergies  Allergen Reactions  . Asa [Aspirin] Other (See Comments)    GERD  . Codeine Hives  . Dextroamphetamine Hives  . Erythromycin Hives  . Oxycodone Hives  . Penicillins Hives    History   Social History  . Marital Status: Single    Spouse Name: N/A  . Number of Children: 0  . Years of Education: college   Occupational History  . substitute teacher    Social History Main Topics  . Smoking status: Never Smoker   . Smokeless tobacco: Not on file  . Alcohol Use: No  . Drug Use: No  . Sexual Activity:    Partners: Male    Birth Control/ Protection: Condom   Other Topics Concern  . Not on file    Social History Narrative   Lives with her boyfriend.    Family History  Problem Relation Age of Onset  . Cancer Brother 30    renal cell carcinoma  . Thyroid disease Sister   . Thyroid disease Sister     BP 157/81 mmHg  Pulse 97  Ht 5\' 4"  (1.626 m)  Wt 152 lb (68.947 kg)  BMI 26.08 kg/m2  Review of Systems: See HPI above.    Objective:  Physical Exam:  Gen: NAD  Right foot: Transverse arch collapse.  Hammering mainly of 2nd-4th digits.  No bruising, swelling, erythema, other deformity. TTP proximal 2nd digit and 2nd metatarsal distally. FROM ankle without pain. Pain with metatarsal squeeze. NVI distally.  MSK u/s:  No evidence cortical irregularity if 2nd metatarsal.  No edema, neovascularity.    Assessment & Plan:  1. Metatarsalgia - radiographs negative for a nonunion.  MSK u/s also negative - no evidence stress fracture.  Reassured patient.  Advised to try metatarsal pads with or without sports insoles - placed these in insoles of her current shoes and pain was improved.  Advised to try these over the next several weeks.  Consider custom orthotics in future.

## 2015-05-06 ENCOUNTER — Other Ambulatory Visit: Payer: Self-pay | Admitting: Emergency Medicine

## 2015-05-30 ENCOUNTER — Other Ambulatory Visit: Payer: Self-pay | Admitting: Emergency Medicine

## 2015-05-30 ENCOUNTER — Other Ambulatory Visit: Payer: Self-pay | Admitting: Physician Assistant

## 2015-06-30 ENCOUNTER — Other Ambulatory Visit: Payer: Self-pay | Admitting: Physician Assistant

## 2015-07-22 ENCOUNTER — Other Ambulatory Visit: Payer: Self-pay | Admitting: Physician Assistant

## 2015-07-28 ENCOUNTER — Other Ambulatory Visit: Payer: Self-pay | Admitting: Physician Assistant

## 2015-07-29 ENCOUNTER — Other Ambulatory Visit: Payer: Self-pay | Admitting: *Deleted

## 2015-07-29 DIAGNOSIS — K219 Gastro-esophageal reflux disease without esophagitis: Secondary | ICD-10-CM

## 2015-07-29 MED ORDER — RANITIDINE HCL 150 MG PO TABS: ORAL_TABLET | ORAL | Status: DC

## 2015-07-29 NOTE — Telephone Encounter (Signed)
Lm is patient still taking singular?

## 2015-08-19 ENCOUNTER — Ambulatory Visit (INDEPENDENT_AMBULATORY_CARE_PROVIDER_SITE_OTHER): Payer: BLUE CROSS/BLUE SHIELD | Admitting: Family Medicine

## 2015-08-19 VITALS — BP 132/80 | HR 102 | Temp 98.6°F | Resp 20 | Ht 64.25 in | Wt 167.0 lb

## 2015-08-19 DIAGNOSIS — R059 Cough, unspecified: Secondary | ICD-10-CM

## 2015-08-19 DIAGNOSIS — J069 Acute upper respiratory infection, unspecified: Secondary | ICD-10-CM | POA: Diagnosis not present

## 2015-08-19 DIAGNOSIS — B9789 Other viral agents as the cause of diseases classified elsewhere: Secondary | ICD-10-CM

## 2015-08-19 DIAGNOSIS — R05 Cough: Secondary | ICD-10-CM

## 2015-08-19 MED ORDER — BENZONATATE 100 MG PO CAPS: 100.0000 mg | ORAL_CAPSULE | Freq: Three times a day (TID) | ORAL | Status: DC | PRN

## 2015-08-19 NOTE — Patient Instructions (Signed)
Take benzonatate 1 or 2 pills 3 times daily as needed for cough  Drink plenty of fluids and get rest  If he started developing a lot of green or yellow mucus, running a fever, but me know and I will get you started on an antibiotic. However I do not think it is needed at this time.

## 2015-08-19 NOTE — Progress Notes (Signed)
Patient ID: Ashlee Dennis, female    DOB: 1971/07/14  Age: 44 y.o. MRN: [id number]  Chief Complaint  Patient presents with  . Sinusitis    sinus issues are normal for her.    . Cough    dry cough x several days    Subjective:   Pleasant 44 year old lady who is here with a cough for the last several days. She has not been running a fever. She is not coughing up a lot of mucus. She has not been short of breath. 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 However she is scheduled to return to work on Thursday. She does do some exercise. Has not been wheezing. She does not smoke, drink, or use drugs. She has a regular boyfriend who has had a respiratory tract infection, though they have been trying to keep up her otherwise she is doing well.  Current allergies, medications, problem list, past/family and social histories reviewed.  Objective:  BP 132/80 mmHg  Pulse 102  Temp(Src) 98.6 F (37 C) (Oral)  Resp 20  Ht 5' 4.25" (1.632 m)  Wt 167 lb (75.751 kg)  BMI 28.44 kg/m2  SpO2 98%  No major acute distress. Her TMs are normal. Throat not erythematous. No postnasal drainage noted. Without significant nodes. Chest is clear to auscultation. Heart regular without murmurs, gallops, or arrhythmias. Skin warm and dry.  Assessment & Plan:   Assessment: 1. Cough   2. Viral URI with cough       Plan: Treat symptomatically. If she starts developing more peripheral symptoms will probably go ahead and call in an antibiotic for her. She is a no.  No orders of the defined types were placed in this encounter.    No orders of the defined types were placed in this encounter.         Patient Instructions  Take benzonatate 1 or 2 pills 3 times daily as needed for cough  Drink plenty of fluids and get rest  If he started developing a lot of green or yellow mucus, running a fever, but me know and I will get you started on an antibiotic.  However I do not think it is needed at this time.     Return if symptoms worsen or fail to improve.   HOPPER,DAVID, MD 08/19/2015

## 2015-09-12 ENCOUNTER — Other Ambulatory Visit: Payer: Self-pay | Admitting: Emergency Medicine

## 2015-12-22 ENCOUNTER — Encounter: Payer: Self-pay | Admitting: Neurology

## 2015-12-22 ENCOUNTER — Ambulatory Visit (INDEPENDENT_AMBULATORY_CARE_PROVIDER_SITE_OTHER): Payer: BLUE CROSS/BLUE SHIELD | Admitting: Neurology

## 2015-12-22 VITALS — BP 143/82 | HR 86 | Ht 64.25 in | Wt 166.0 lb

## 2015-12-22 DIAGNOSIS — R202 Paresthesia of skin: Secondary | ICD-10-CM

## 2015-12-22 DIAGNOSIS — G43109 Migraine with aura, not intractable, without status migrainosus: Secondary | ICD-10-CM | POA: Diagnosis not present

## 2015-12-22 DIAGNOSIS — G43909 Migraine, unspecified, not intractable, without status migrainosus: Secondary | ICD-10-CM | POA: Insufficient documentation

## 2015-12-22 NOTE — Progress Notes (Signed)
PATIENT: Ashlee Dennis DOB: 1971-09-27  Chief Complaint  Patient presents with  . Migraine    Reports having two migraines in the last month.  She went home to rest and used Advil which helped to rid the pain.  . Numbness    She sometimes wakes up with numbness in her hands.  Feels it is due to sleeping on them.  She had one occurrence of hand numbness during her last migraine that resolved when the headache was gone.     HISTORICAL  Ashlee Dennis is a 45 years old right-handed female, seen in refer by her primary care doctor Renford DillsRonald Polite in December 22 2015 for evaluation of migraine headaches, intermittent numbness of hands  I reviewed and summarized her medical record, she has diabetes that was diet controlled, hypothyroidism with small goiter, on thyroid supplement, mild sleep apnea, vocal cord paralysis, history of kidney stone, GERD, history of right breast cancer, partial right mastectomy, does not require chemoradiation therapy.  She reported a history of baseball injury in 2012, a baseball hit her face, no loss of consciousness  She had a history of migraine since 1992, only happened occasionally, she had to typical migraine within a week at the end of February, right parietal area severe pounding headache with associated light noise sensitivity, nauseous, movements make it worse, lasting about 1 hour, relieved by taking a nap, Advil as needed. She also reported visual distortion before the onset of moderate to severe right lateralized typical migraine headache  But in between, she complains of bilateral "sinus" problem, on further questioning, she reported bilateral retro-orbital area mild to moderate pressure sensation usually induced by weather change, exertion, bright sunlight, hungry, lack of sleep, which is consistent with mild migraine  Since January 2017 she complains of intermittent bilateral hands paresthesia, usually triggered by putting pressure on her hands during sleep,    REVIEW OF SYSTEMS: Full 14 system review of systems performed and notable only for Cramps, achy muscles, allergy ALLERGIES: Allergies  Allergen Reactions  . Asa [Aspirin] Other (See Comments)    GERD  . Aspartame And Phenylalanine     Joint aches  . Codeine Hives  . Dextroamphetamine Hives  . Erythromycin Hives  . Oxycodone Hives  . Penicillins Hives    HOME MEDICATIONS: Current Outpatient Prescriptions  Medication Sig Dispense Refill  . benzonatate (TESSALON) 100 MG capsule Take 1-2 capsules (100-200 mg total) by mouth 3 (three) times daily as needed. 30 capsule 0  . fexofenadine (ALLEGRA) 180 MG tablet Take 180 mg by mouth daily.    Marland Kitchen. levothyroxine (SYNTHROID, LEVOTHROID) 75 MCG tablet Take 75 mcg by mouth daily.    . montelukast (SINGULAIR) 10 MG tablet TAKE 1 TABLET (10 MG) BY MOUTH AT BEDTIME 30 tablet 0  . ranitidine (ZANTAC) 150 MG tablet TAKE 1 TABLET(150 MG) BY MOUTH TWICE DAILY 180 tablet 1   No current facility-administered medications for this visit.    PAST MEDICAL HISTORY: Past Medical History  Diagnosis Date  . Allergy   . GERD (gastroesophageal reflux disease)   . Diabetes mellitus without complication (HCC)   . Thyroid disease     PAST SURGICAL HISTORY: Past Surgical History  Procedure Laterality Date  . Knee arthroscopy    . Abdominal hysterectomy    . Breast surgery      Cancer  . Teeth implants      x 2    FAMILY HISTORY: Family History  Problem Relation Age of Onset  .  Cancer Brother 30    renal cell carcinoma  . Thyroid disease Sister   . Thyroid disease Sister   . Skin cancer Father   . Healthy Mother     SOCIAL HISTORY:  Social History   Social History  . Marital Status: Single    Spouse Name: N/A  . Number of Children: 0  . Years of Education: college   Occupational History  . substitute teacher    Social History Main Topics  . Smoking status: Never Smoker   . Smokeless tobacco: Not on file  . Alcohol Use: No  .  Drug Use: No  . Sexual Activity:    Partners: Male    Birth Control/ Protection: Condom   Other Topics Concern  . Not on file   Social History Narrative   Lives with her boyfriend.   Right-handed.   2-3 cups caffeine per day.     PHYSICAL EXAM   Filed Vitals:   12/22/15 1500  BP: 143/82  Pulse: 86  Height: 5' 4.25" (1.632 m)  Weight: 166 lb (75.297 kg)    Not recorded      Body mass index is 28.27 kg/(m^2).  PHYSICAL EXAMNIATION:  Gen: NAD, conversant, well nourised, obese, well groomed                     Cardiovascular: Regular rate rhythm, no peripheral edema, warm, nontender. Eyes: Conjunctivae clear without exudates or hemorrhage Neck: Supple, no carotid bruise. Pulmonary: Clear to auscultation bilaterally   NEUROLOGICAL EXAM:  MENTAL STATUS: Speech:    Speech is normal; fluent and spontaneous with normal comprehension.  Cognition:     Orientation to time, place and person     Normal recent and remote memory     Normal Attention span and concentration     Normal Language, naming, repeating,spontaneous speech     Fund of knowledge   CRANIAL NERVES: CN II: Visual fields are full to confrontation. Fundoscopic exam is normal with sharp discs and no vascular changes. Pupils are round equal and briskly reactive to light. CN III, IV, VI: extraocular movement are normal. No ptosis. CN V: Facial sensation is intact to pinprick in all 3 divisions bilaterally. Corneal responses are intact.  CN VII: Face is symmetric with normal eye closure and smile. CN VIII: Hearing is normal to rubbing fingers CN IX, X: Palate elevates symmetrically. Phonation is normal. CN XI: Head turning and shoulder shrug are intact CN XII: Tongue is midline with normal movements and no atrophy.  MOTOR: There is no pronator drift of out-stretched arms. Muscle bulk and tone are normal. Muscle strength is normal.  REFLEXES: Reflexes are 2+ and symmetric at the biceps, triceps, knees, and  ankles. Plantar responses are flexor.  SENSORY: Intact to light touch, pinprick, position sense, and vibration sense are intact in fingers and toes.  COORDINATION: Rapid alternating movements and fine finger movements are intact. There is no dysmetria on finger-to-nose and heel-knee-shin.    GAIT/STANCE: Posture is normal. Gait is steady with normal steps, base, arm swing, and turning. Heel and toe walking are normal. Tandem gait is normal.  Romberg is absent.   DIAGNOSTIC DATA (LABS, IMAGING, TESTING) - I reviewed patient records, labs, notes, testing and imaging myself where available.   ASSESSMENT AND PLAN  Ashlee Dennis is a 45 y.o. female    Chronic migraine  Continue NSAIDs as needed, Excedrin Migraine as needed  Bilateral hands paresthesia  Most consistent with mild bilateral carpal  tunnel syndrome  Wrist splint,  Avoid repetitive wrist flexion/extension    Levert Feinstein, M.D. Ph.D.  [facility] [address], [address] [place], [place] [address] Ph: [phone] Fax: [phone]  CC: Renford Dills, MD

## 2016-01-12 DIAGNOSIS — G5601 Carpal tunnel syndrome, right upper limb: Secondary | ICD-10-CM | POA: Diagnosis not present

## 2016-01-12 DIAGNOSIS — G5602 Carpal tunnel syndrome, left upper limb: Secondary | ICD-10-CM | POA: Diagnosis not present

## 2016-01-12 DIAGNOSIS — M7711 Lateral epicondylitis, right elbow: Secondary | ICD-10-CM | POA: Diagnosis not present

## 2016-01-13 DIAGNOSIS — E139 Other specified diabetes mellitus without complications: Secondary | ICD-10-CM | POA: Diagnosis not present

## 2016-02-03 DIAGNOSIS — G5602 Carpal tunnel syndrome, left upper limb: Secondary | ICD-10-CM | POA: Diagnosis not present

## 2016-02-03 DIAGNOSIS — G5601 Carpal tunnel syndrome, right upper limb: Secondary | ICD-10-CM | POA: Diagnosis not present

## 2016-02-09 DIAGNOSIS — M25521 Pain in right elbow: Secondary | ICD-10-CM | POA: Diagnosis not present

## 2016-02-09 DIAGNOSIS — M7711 Lateral epicondylitis, right elbow: Secondary | ICD-10-CM | POA: Diagnosis not present

## 2016-02-12 DIAGNOSIS — M7711 Lateral epicondylitis, right elbow: Secondary | ICD-10-CM | POA: Diagnosis not present

## 2016-02-12 DIAGNOSIS — M25521 Pain in right elbow: Secondary | ICD-10-CM | POA: Diagnosis not present

## 2016-02-17 DIAGNOSIS — M25521 Pain in right elbow: Secondary | ICD-10-CM | POA: Diagnosis not present

## 2016-02-17 DIAGNOSIS — M7711 Lateral epicondylitis, right elbow: Secondary | ICD-10-CM | POA: Diagnosis not present

## 2016-02-19 DIAGNOSIS — M25521 Pain in right elbow: Secondary | ICD-10-CM | POA: Diagnosis not present

## 2016-02-19 DIAGNOSIS — M7711 Lateral epicondylitis, right elbow: Secondary | ICD-10-CM | POA: Diagnosis not present

## 2016-02-22 DIAGNOSIS — J069 Acute upper respiratory infection, unspecified: Secondary | ICD-10-CM | POA: Diagnosis not present

## 2016-03-01 DIAGNOSIS — M7711 Lateral epicondylitis, right elbow: Secondary | ICD-10-CM | POA: Diagnosis not present

## 2016-03-01 DIAGNOSIS — M25521 Pain in right elbow: Secondary | ICD-10-CM | POA: Diagnosis not present

## 2016-03-04 DIAGNOSIS — M25521 Pain in right elbow: Secondary | ICD-10-CM | POA: Diagnosis not present

## 2016-03-04 DIAGNOSIS — M7711 Lateral epicondylitis, right elbow: Secondary | ICD-10-CM | POA: Diagnosis not present

## 2016-03-10 DIAGNOSIS — M7711 Lateral epicondylitis, right elbow: Secondary | ICD-10-CM | POA: Diagnosis not present

## 2016-03-10 DIAGNOSIS — M25521 Pain in right elbow: Secondary | ICD-10-CM | POA: Diagnosis not present

## 2016-03-11 DIAGNOSIS — M7711 Lateral epicondylitis, right elbow: Secondary | ICD-10-CM | POA: Diagnosis not present

## 2016-03-11 DIAGNOSIS — M25521 Pain in right elbow: Secondary | ICD-10-CM | POA: Diagnosis not present

## 2016-03-15 DIAGNOSIS — M25521 Pain in right elbow: Secondary | ICD-10-CM | POA: Diagnosis not present

## 2016-03-15 DIAGNOSIS — M7711 Lateral epicondylitis, right elbow: Secondary | ICD-10-CM | POA: Diagnosis not present

## 2016-03-15 DIAGNOSIS — G5631 Lesion of radial nerve, right upper limb: Secondary | ICD-10-CM | POA: Diagnosis not present

## 2016-03-21 ENCOUNTER — Other Ambulatory Visit: Payer: Self-pay | Admitting: Emergency Medicine

## 2016-03-23 DIAGNOSIS — E039 Hypothyroidism, unspecified: Secondary | ICD-10-CM | POA: Diagnosis not present

## 2016-03-23 DIAGNOSIS — G5603 Carpal tunnel syndrome, bilateral upper limbs: Secondary | ICD-10-CM | POA: Diagnosis not present

## 2016-03-23 DIAGNOSIS — E139 Other specified diabetes mellitus without complications: Secondary | ICD-10-CM | POA: Diagnosis not present

## 2016-03-23 DIAGNOSIS — G43909 Migraine, unspecified, not intractable, without status migrainosus: Secondary | ICD-10-CM | POA: Diagnosis not present

## 2016-03-23 DIAGNOSIS — G5631 Lesion of radial nerve, right upper limb: Secondary | ICD-10-CM | POA: Diagnosis not present

## 2016-03-31 DIAGNOSIS — G5631 Lesion of radial nerve, right upper limb: Secondary | ICD-10-CM | POA: Diagnosis not present

## 2016-03-31 DIAGNOSIS — G5603 Carpal tunnel syndrome, bilateral upper limbs: Secondary | ICD-10-CM | POA: Diagnosis not present

## 2016-04-01 DIAGNOSIS — G5603 Carpal tunnel syndrome, bilateral upper limbs: Secondary | ICD-10-CM | POA: Diagnosis not present

## 2016-04-01 DIAGNOSIS — G5631 Lesion of radial nerve, right upper limb: Secondary | ICD-10-CM | POA: Diagnosis not present

## 2016-04-02 DIAGNOSIS — G5601 Carpal tunnel syndrome, right upper limb: Secondary | ICD-10-CM | POA: Diagnosis not present

## 2016-04-02 DIAGNOSIS — G5631 Lesion of radial nerve, right upper limb: Secondary | ICD-10-CM | POA: Diagnosis not present

## 2016-04-12 DIAGNOSIS — G5631 Lesion of radial nerve, right upper limb: Secondary | ICD-10-CM | POA: Diagnosis not present

## 2016-04-20 DIAGNOSIS — G5631 Lesion of radial nerve, right upper limb: Secondary | ICD-10-CM | POA: Diagnosis not present

## 2016-04-20 DIAGNOSIS — G5601 Carpal tunnel syndrome, right upper limb: Secondary | ICD-10-CM | POA: Diagnosis not present

## 2016-05-05 ENCOUNTER — Ambulatory Visit (INDEPENDENT_AMBULATORY_CARE_PROVIDER_SITE_OTHER): Payer: BLUE CROSS/BLUE SHIELD | Admitting: Emergency Medicine

## 2016-05-05 VITALS — BP 122/80 | HR 101 | Temp 98.7°F | Resp 18 | Ht 64.25 in | Wt 166.0 lb

## 2016-05-05 DIAGNOSIS — R059 Cough, unspecified: Secondary | ICD-10-CM

## 2016-05-05 DIAGNOSIS — K219 Gastro-esophageal reflux disease without esophagitis: Secondary | ICD-10-CM

## 2016-05-05 DIAGNOSIS — R05 Cough: Secondary | ICD-10-CM

## 2016-05-05 MED ORDER — RANITIDINE HCL 150 MG PO TABS: ORAL_TABLET | ORAL | 11 refills | Status: DC

## 2016-05-05 NOTE — Patient Instructions (Addendum)
IF you received an x-ray today, you will receive an invoice from [facility]. Please contact [facility] at [phone] with questions or concerns regarding your invoice.   IF you received labwork today, you will receive an invoice from [employer]. Please contact [facility] at [phone] with questions or concerns regarding your invoice.   Our billing staff will not be able to assist you with questions regarding bills from these companies.  You will be contacted with the lab results as soon as they are available. The fastest way to get your results is to activate your My Chart account. Instructions are located on the last page of this paperwork. If you have not heard from [place] regarding the results in 2 weeks, please contact this office.     Gastroesophageal Reflux Disease, Adult Normally, food travels down the esophagus and stays in the stomach to be digested. However, when a person has gastroesophageal reflux disease (GERD), food and stomach acid move back up into the esophagus. When this happens, the esophagus becomes sore and inflamed. Over time, GERD can create small holes (ulcers) in the lining of the esophagus.  CAUSES This condition is caused by a problem with the muscle between the esophagus and the stomach (lower esophageal sphincter, or LES). Normally, the LES muscle closes after food passes through the esophagus to the stomach. When the LES is weakened or abnormal, it does not close properly, and that allows food and stomach acid to go back up into the esophagus. The LES can be weakened by certain dietary substances, medicines, and medical conditions, including:  Tobacco use.  Pregnancy.  Having a hiatal hernia.  Heavy alcohol use.  Certain foods and beverages, such as coffee, chocolate, onions, and peppermint. RISK FACTORS This condition is more likely to develop in:  People who have an increased body weight.  People who  have connective tissue disorders.  People who use NSAID medicines. SYMPTOMS Symptoms of this condition include:  Heartburn.  Difficult or painful swallowing.  The feeling of having a lump in the throat.  Abitter taste in the mouth.  Bad breath.  Having a large amount of saliva.  Having an upset or bloated stomach.  Belching.  Chest pain.  Shortness of breath or wheezing.  Ongoing (chronic) cough or a night-time cough.  Wearing away of tooth enamel.  Weight loss. Different conditions can cause chest pain. Make sure to see your health care provider if you experience chest pain. DIAGNOSIS Your health care provider will take a medical history and perform a physical exam. To determine if you have mild or severe GERD, your health care provider may also monitor how you respond to treatment. You may also have other tests, including:  An endoscopy toexamine your stomach and esophagus with a small camera.  A test thatmeasures the acidity level in your esophagus.  A test thatmeasures how much pressure is on your esophagus.  A barium swallow or modified barium swallow to show the shape, size, and functioning of your esophagus. TREATMENT The goal of treatment is to help relieve your symptoms and to prevent complications. Treatment for this condition may vary depending on how severe your symptoms are. Your health care provider may recommend:  Changes to your diet.  Medicine.  Surgery. HOME CARE INSTRUCTIONS Diet  Follow a diet as recommended by your health care provider. This may involve avoiding foods and drinks such as:  Coffee and tea (with or without caffeine).  Drinks that containalcohol.  Energy drinks and sports drinks.  Carbonated drinks or sodas.  Chocolate and cocoa.  Peppermint and mint flavorings.  Garlic and onions.  Horseradish.  Spicy and acidic foods, including peppers, chili powder, curry powder, vinegar, hot sauces, and barbecue  sauce.  Citrus fruit juices and citrus fruits, such as oranges, lemons, and limes.  Tomato-based foods, such as red sauce, chili, salsa, and pizza with red sauce.  Fried and fatty foods, such as donuts, french fries, potato chips, and high-fat dressings.  High-fat meats, such as hot dogs and fatty cuts of red and white meats, such as rib eye steak, sausage, ham, and bacon.  High-fat dairy items, such as whole milk, butter, and cream cheese.  Eat small, frequent meals instead of large meals.  Avoid drinking large amounts of liquid with your meals.  Avoid eating meals during the 2-3 hours before bedtime.  Avoid lying down right after you eat.  Do not exercise right after you eat. General Instructions  Pay attention to any changes in your symptoms.  Take over-the-counter and prescription medicines only as told by your health care provider. Do not take aspirin, ibuprofen, or other NSAIDs unless your health care provider told you to do so.  Do not use any tobacco products, including cigarettes, chewing tobacco, and e-cigarettes. If you need help quitting, ask your health care provider.  Wear loose-fitting clothing. Do not wear anything tight around your waist that causes pressure on your abdomen.  Raise (elevate) the head of your bed 6 inches (15cm).  Try to reduce your stress, such as with yoga or meditation. If you need help reducing stress, ask your health care provider.  If you are overweight, reduce your weight to an amount that is healthy for you. Ask your health care provider for guidance about a safe weight loss goal.  Keep all follow-up visits as told by your health care provider. This is important. SEEK MEDICAL CARE IF:  You have new symptoms.  You have unexplained weight loss.  You have difficulty swallowing, or it hurts to swallow.  You have wheezing or a persistent cough.  Your symptoms do not improve with treatment.  You have a hoarse voice. SEEK  IMMEDIATE MEDICAL CARE IF:  You have pain in your arms, neck, jaw, teeth, or back.  You feel sweaty, dizzy, or light-headed.  You have chest pain or shortness of breath.  You vomit and your vomit looks like blood or coffee grounds.  You faint.  Your stool is bloody or black.  You cannot swallow, drink, or eat.   This information is not intended to replace advice given to you by your health care provider. Make sure you discuss any questions you have with your health care provider.   Document Released: 07/07/2005 Document Revised: 06/18/2015 Document Reviewed: 01/22/2015 Elsevier Interactive Patient Education [employer]

## 2016-05-05 NOTE — Progress Notes (Signed)
By signing my name below, I, Raven Small, attest that this documentation has been prepared under the direction and in the presence of Lesle Chris, MD.  Electronically Signed: Andrew Au, ED Scribe. 05/03/2016. 9:06 AM.  Chief Complaint:  Chief Complaint  Patient presents with  . Medication Refill    ranitidine    HPI: Ashlee Dennis is a 45 y.o. female who reports to [facility] today for a medication refill regarding zantac. Pt was seen by Gurney Maxin for cough and was started Zantac, which relieved her symptoms as well as improved reflux symptoms. . Pt is tolerating medication well. Otherwise pt is doing well.   Pt states sugar have been well controlled.   Past Medical History:  Diagnosis Date  . Allergy   . Diabetes mellitus without complication (HCC)   . GERD (gastroesophageal reflux disease)   . Thyroid disease    Past Surgical History:  Procedure Laterality Date  . ABDOMINAL HYSTERECTOMY    . BREAST SURGERY     Cancer  . KNEE ARTHROSCOPY    . Teeth Implants     x 2   Social History   Social History  . Marital status: Single    Spouse name: N/A  . Number of children: 0  . Years of education: college   Occupational History  . substitute teacher [employer]   Social History Main Topics  . Smoking status: Never Smoker  . Smokeless tobacco: Never Used  . Alcohol use No  . Drug use: No  . Sexual activity: Yes    Partners: Male    Birth control/ protection: Condom   Other Topics Concern  . None   Social History Narrative   Lives with her boyfriend.   Right-handed.   2-3 cups caffeine per day.   Family History  Problem Relation Age of Onset  . Cancer Brother 30    renal cell carcinoma  . Healthy Mother   . Skin cancer Father   . Thyroid disease Sister   . Thyroid disease Sister    Allergies  Allergen Reactions  . Asa [Aspirin] Other (See Comments)    GERD  . Aspartame And Phenylalanine     Joint aches  . Codeine Hives  . Dextroamphetamine Hives  .  Erythromycin Hives  . Metformin And Related Hives  . Mint Chocolate Chip Flavor   . Oxycodone Hives  . Penicillins Hives  . Pennsaid [Diclofenac Sodium] Hives   Prior to Admission medications   Medication Sig Start Date End Date Taking? Authorizing Provider  fexofenadine (ALLEGRA) 180 MG tablet Take 180 mg by mouth daily.   Yes Historical Provider, MD  levothyroxine (SYNTHROID, LEVOTHROID) 75 MCG tablet Take 75 mcg by mouth daily.   Yes Historical Provider, MD  montelukast (SINGULAIR) 10 MG tablet TAKE 1 TABLET (10 MG) BY MOUTH AT BEDTIME 07/01/15  Yes Chelle Jeffery, PA-C  ranitidine (ZANTAC) 150 MG tablet TAKE 1 TABLET(150 MG) BY MOUTH TWICE DAILY 03/23/16  Yes Wallis Bamberg, PA-C  benzonatate (TESSALON) 100 MG capsule Take 1-2 capsules (100-200 mg total) by mouth 3 (three) times daily as needed. Patient not taking: Reported on 05/05/2016 08/19/15   Peyton Najjar, MD     ROS: The patient denies fevers, chills, night sweats, unintentional weight loss, chest pain, palpitations, wheezing, dyspnea on exertion, nausea, vomiting, abdominal pain, dysuria, hematuria, melena, numbness, weakness, or tingling.   All other systems have been reviewed and were otherwise negative with the exception of those mentioned in the HPI  and as above.    PHYSICAL EXAM: Vitals:   05/05/16 0829  BP: 122/80  Pulse: (!) 101  Resp: 18  Temp: 98.7 F (37.1 C)   Body mass index is 28.27 kg/m.   General: Alert, no acute distress HEENT:  Normocephalic, atraumatic, oropharynx patent. Palpable thyroid goiter.  Eye: Nonie Hoyer [facility] Cardiovascular:  Regular rate and rhythm, no rubs murmurs or gallops.  No Carotid bruits, radial pulse intact. No pedal edema.  Respiratory: Clear to auscultation bilaterally.  No wheezes, rales, or rhonchi.  No cyanosis, no use of accessory musculature Abdominal: No organomegaly, abdomen is soft and non-tender, positive bowel sounds.  No masses. Musculoskeletal: Gait intact. No edema,  tenderness Skin: No rashes. Neurologic: Facial musculature symmetric. Psychiatric: Patientacts appropriately throughout our interaction. Lymphatic: No cervical or submandibular lymphadenopathy   ASSESSMENT/PLAN: Patient is doing well. Her cough and reflux are controlled with Zantac. This was refilled. She was given instructions regarding management of reflux. Her regular physician is Dr. Nehemiah Settle.I personally performed the services described in this documentation, which was scribed in my presence. The recorded information has been reviewed and is accurate.   Gross sideeffects, risk and benefits, and alternatives of medications d/w patient. Patient is aware that all medications have potential sideeffects and we are unable to predict every sideeffect or drug-drug interaction that may occur.  Lesle Chris MD 05/05/2016 9:06 AM

## 2016-05-13 DIAGNOSIS — H6123 Impacted cerumen, bilateral: Secondary | ICD-10-CM | POA: Diagnosis not present

## 2016-05-13 DIAGNOSIS — H903 Sensorineural hearing loss, bilateral: Secondary | ICD-10-CM | POA: Diagnosis not present

## 2016-05-17 DIAGNOSIS — E119 Type 2 diabetes mellitus without complications: Secondary | ICD-10-CM | POA: Diagnosis not present

## 2016-05-17 DIAGNOSIS — S9031XA Contusion of right foot, initial encounter: Secondary | ICD-10-CM | POA: Diagnosis not present

## 2016-05-24 DIAGNOSIS — E139 Other specified diabetes mellitus without complications: Secondary | ICD-10-CM | POA: Diagnosis not present

## 2016-06-21 DIAGNOSIS — Z23 Encounter for immunization: Secondary | ICD-10-CM | POA: Diagnosis not present

## 2016-07-06 DIAGNOSIS — E119 Type 2 diabetes mellitus without complications: Secondary | ICD-10-CM | POA: Diagnosis not present

## 2016-07-12 ENCOUNTER — Encounter: Payer: Self-pay | Admitting: Skilled Nursing Facility1

## 2016-07-12 ENCOUNTER — Encounter: Payer: BLUE CROSS/BLUE SHIELD | Attending: Internal Medicine | Admitting: Skilled Nursing Facility1

## 2016-07-12 DIAGNOSIS — Z713 Dietary counseling and surveillance: Secondary | ICD-10-CM | POA: Insufficient documentation

## 2016-07-12 DIAGNOSIS — E119 Type 2 diabetes mellitus without complications: Secondary | ICD-10-CM | POA: Diagnosis not present

## 2016-07-12 NOTE — Progress Notes (Signed)
Diabetes Self-Management Education  Visit Type: First/Initial  Appt. Start Time: 4:00 Appt. End Time: 4:30  07/13/2016  Ashlee Dennis, identified by name and date of birth, is a 45 y.o. female with a diagnosis of Diabetes: Type 2.   ASSESSMENT  Height 5\' 4"  (1.626 m), weight 163 lb 1.6 oz (74 kg). Body mass index is 28 kg/m. Pt states she is allergic to splenda and aspartame. Pt became tearful when she realized she could eat bread.     Diabetes Self-Management Education - 07/12/16 1559      Visit Information   Visit Type First/Initial     Initial Visit   Diabetes Type Type 2   Are you currently following a meal plan? No   Are you taking your medications as prescribed? Yes   Date Diagnosed 2008     Health Coping   How would you rate your overall health? Fair     Psychosocial Assessment   Patient Belief/Attitude about Diabetes Motivated to manage diabetes   Self-care barriers None     Pre-Education Assessment   Patient understands the diabetes disease and treatment process. Needs Review   Patient understands incorporating nutritional management into lifestyle. Needs Review   Patient undertands incorporating physical activity into lifestyle. Needs Review   Patient understands using medications safely. Needs Review   Patient understands monitoring blood glucose, interpreting and using results Needs Review   Patient understands prevention, detection, and treatment of acute complications. Needs Review   Patient understands prevention, detection, and treatment of chronic complications. Needs Review   Patient understands how to develop strategies to address psychosocial issues. Needs Review   Patient understands how to develop strategies to promote health/change behavior. Needs Review     Complications   Last HgB A1C per patient/outside source 6.6 %   How often do you check your blood sugar? --  1-2 times a week   Have you had a dilated eye exam in the past 12 months? Yes   Have you had a dental exam in the past 12 months? Yes   Are you checking your feet? Yes   How many days per week are you checking your feet? 7     Exercise   Exercise Type Light (walking / raking leaves)   How many days per week to you exercise? 4   How many minutes per day do you exercise? 30   Total minutes per week of exercise 120      Individualized Plan for Diabetes Self-Management Training:   Learning Objective:  Patient will have a greater understanding of diabetes self-management. Patient education plan is to attend individual and/or group sessions per assessed needs and concerns.   Plan:   Patient Instructions  -Always bring your meter with you everywhere you go -Always Properly dispose of your needles:  -Discard in a hard plastic/metal container with a lid (something the needle can't puncture)  -Write Do Not Recycle on the outside of the container  -Example: A laundry detergent bottle -Never use the same needle more than once -Eat 2-3 carbohydrate choices for each meal and 1 for each snack -A meal: carbohydrates, protein, vegetable -A snack: A Fruit OR Vegetable AND Protein  -Try to be more active -Always pay attention to your body keeping watchful of possible low blood sugar (below 70) or high blood sugar (above 200)    Expected Outcomes:     Education material provided: Living Well with Diabetes, Meal plan card, My Plate and Snack sheet  If problems or questions, patient to contact team via:  Phone  Future DSME appointment:  Allergic to aspartame and splenda

## 2016-07-12 NOTE — Patient Instructions (Addendum)
-  Always bring your meter with you everywhere you go -Always Properly dispose of your needles:  -Discard in a hard plastic/metal container with a lid (something the needle can't puncture)  -Write Do Not Recycle on the outside of the container  -Example: A laundry detergent bottle -Never use the same needle more than once -Eat 2-3 carbohydrate choices for each meal and 1 for each snack -A meal: carbohydrates, protein, vegetable -A snack: A Fruit OR Vegetable AND Protein  -Try to be more active -Always pay attention to your body keeping watchful of possible low blood sugar (below 70) or high blood sugar (above 200)

## 2016-07-15 DIAGNOSIS — M76821 Posterior tibial tendinitis, right leg: Secondary | ICD-10-CM | POA: Diagnosis not present

## 2016-07-15 DIAGNOSIS — G5761 Lesion of plantar nerve, right lower limb: Secondary | ICD-10-CM | POA: Diagnosis not present

## 2016-07-15 DIAGNOSIS — M216X2 Other acquired deformities of left foot: Secondary | ICD-10-CM | POA: Diagnosis not present

## 2016-07-15 DIAGNOSIS — M2041 Other hammer toe(s) (acquired), right foot: Secondary | ICD-10-CM | POA: Diagnosis not present

## 2016-07-15 DIAGNOSIS — E119 Type 2 diabetes mellitus without complications: Secondary | ICD-10-CM | POA: Diagnosis not present

## 2016-07-15 DIAGNOSIS — M216X1 Other acquired deformities of right foot: Secondary | ICD-10-CM | POA: Diagnosis not present

## 2016-07-15 DIAGNOSIS — M7751 Other enthesopathy of right foot: Secondary | ICD-10-CM | POA: Diagnosis not present

## 2016-07-22 DIAGNOSIS — G5761 Lesion of plantar nerve, right lower limb: Secondary | ICD-10-CM | POA: Diagnosis not present

## 2016-07-22 DIAGNOSIS — M7751 Other enthesopathy of right foot: Secondary | ICD-10-CM | POA: Diagnosis not present

## 2016-07-27 DIAGNOSIS — G5761 Lesion of plantar nerve, right lower limb: Secondary | ICD-10-CM | POA: Diagnosis not present

## 2016-08-02 DIAGNOSIS — E119 Type 2 diabetes mellitus without complications: Secondary | ICD-10-CM | POA: Diagnosis not present

## 2016-08-11 DIAGNOSIS — J310 Chronic rhinitis: Secondary | ICD-10-CM | POA: Diagnosis not present

## 2016-08-26 DIAGNOSIS — H6123 Impacted cerumen, bilateral: Secondary | ICD-10-CM | POA: Diagnosis not present

## 2016-08-26 DIAGNOSIS — H903 Sensorineural hearing loss, bilateral: Secondary | ICD-10-CM | POA: Diagnosis not present

## 2016-08-31 DIAGNOSIS — D2262 Melanocytic nevi of left upper limb, including shoulder: Secondary | ICD-10-CM | POA: Diagnosis not present

## 2016-08-31 DIAGNOSIS — L818 Other specified disorders of pigmentation: Secondary | ICD-10-CM | POA: Diagnosis not present

## 2016-08-31 DIAGNOSIS — L821 Other seborrheic keratosis: Secondary | ICD-10-CM | POA: Diagnosis not present

## 2016-08-31 DIAGNOSIS — D2361 Other benign neoplasm of skin of right upper limb, including shoulder: Secondary | ICD-10-CM | POA: Diagnosis not present

## 2016-09-08 DIAGNOSIS — M545 Low back pain: Secondary | ICD-10-CM | POA: Diagnosis not present

## 2016-09-08 DIAGNOSIS — M25552 Pain in left hip: Secondary | ICD-10-CM | POA: Diagnosis not present

## 2016-09-09 DIAGNOSIS — M545 Low back pain: Secondary | ICD-10-CM | POA: Diagnosis not present

## 2016-09-09 DIAGNOSIS — M5416 Radiculopathy, lumbar region: Secondary | ICD-10-CM | POA: Diagnosis not present

## 2016-09-13 DIAGNOSIS — M5416 Radiculopathy, lumbar region: Secondary | ICD-10-CM | POA: Diagnosis not present

## 2016-09-13 DIAGNOSIS — M545 Low back pain: Secondary | ICD-10-CM | POA: Diagnosis not present

## 2016-09-16 DIAGNOSIS — M5416 Radiculopathy, lumbar region: Secondary | ICD-10-CM | POA: Diagnosis not present

## 2016-09-16 DIAGNOSIS — M545 Low back pain: Secondary | ICD-10-CM | POA: Diagnosis not present

## 2016-09-20 DIAGNOSIS — Z1231 Encounter for screening mammogram for malignant neoplasm of breast: Secondary | ICD-10-CM | POA: Diagnosis not present

## 2016-09-20 DIAGNOSIS — M5416 Radiculopathy, lumbar region: Secondary | ICD-10-CM | POA: Diagnosis not present

## 2016-09-20 DIAGNOSIS — M545 Low back pain: Secondary | ICD-10-CM | POA: Diagnosis not present

## 2016-09-20 DIAGNOSIS — Z803 Family history of malignant neoplasm of breast: Secondary | ICD-10-CM | POA: Diagnosis not present

## 2016-09-23 DIAGNOSIS — M5416 Radiculopathy, lumbar region: Secondary | ICD-10-CM | POA: Diagnosis not present

## 2016-09-23 DIAGNOSIS — M545 Low back pain: Secondary | ICD-10-CM | POA: Diagnosis not present

## 2016-09-27 DIAGNOSIS — M5416 Radiculopathy, lumbar region: Secondary | ICD-10-CM | POA: Diagnosis not present

## 2016-09-27 DIAGNOSIS — M545 Low back pain: Secondary | ICD-10-CM | POA: Diagnosis not present

## 2016-09-29 DIAGNOSIS — Z0000 Encounter for general adult medical examination without abnormal findings: Secondary | ICD-10-CM | POA: Diagnosis not present

## 2016-09-29 DIAGNOSIS — E119 Type 2 diabetes mellitus without complications: Secondary | ICD-10-CM | POA: Diagnosis not present

## 2016-09-30 DIAGNOSIS — M5416 Radiculopathy, lumbar region: Secondary | ICD-10-CM | POA: Diagnosis not present

## 2016-09-30 DIAGNOSIS — M545 Low back pain: Secondary | ICD-10-CM | POA: Diagnosis not present

## 2016-10-05 DIAGNOSIS — M5416 Radiculopathy, lumbar region: Secondary | ICD-10-CM | POA: Diagnosis not present

## 2016-10-05 DIAGNOSIS — M545 Low back pain: Secondary | ICD-10-CM | POA: Diagnosis not present

## 2016-10-07 DIAGNOSIS — Z01419 Encounter for gynecological examination (general) (routine) without abnormal findings: Secondary | ICD-10-CM | POA: Diagnosis not present

## 2016-10-07 DIAGNOSIS — E8941 Symptomatic postprocedural ovarian failure: Secondary | ICD-10-CM | POA: Diagnosis not present

## 2016-10-07 DIAGNOSIS — Z1389 Encounter for screening for other disorder: Secondary | ICD-10-CM | POA: Diagnosis not present

## 2016-10-07 DIAGNOSIS — Z6826 Body mass index (BMI) 26.0-26.9, adult: Secondary | ICD-10-CM | POA: Diagnosis not present

## 2016-10-13 DIAGNOSIS — M5416 Radiculopathy, lumbar region: Secondary | ICD-10-CM | POA: Diagnosis not present

## 2016-10-13 DIAGNOSIS — M545 Low back pain: Secondary | ICD-10-CM | POA: Diagnosis not present

## 2016-10-14 ENCOUNTER — Other Ambulatory Visit: Payer: Self-pay | Admitting: Sports Medicine

## 2016-10-14 DIAGNOSIS — M545 Low back pain: Secondary | ICD-10-CM | POA: Diagnosis not present

## 2016-10-16 ENCOUNTER — Ambulatory Visit
Admission: RE | Admit: 2016-10-16 | Discharge: 2016-10-16 | Disposition: A | Discharge status: Home / Self Care | Payer: PRIVATE HEALTH INSURANCE | Source: Ambulatory Visit | Attending: Sports Medicine | Admitting: Sports Medicine

## 2016-10-16 DIAGNOSIS — M545 Low back pain: Secondary | ICD-10-CM

## 2016-10-16 DIAGNOSIS — M5126 Other intervertebral disc displacement, lumbar region: Secondary | ICD-10-CM | POA: Diagnosis not present

## 2016-10-19 DIAGNOSIS — M545 Low back pain: Secondary | ICD-10-CM | POA: Diagnosis not present

## 2016-10-21 DIAGNOSIS — M5416 Radiculopathy, lumbar region: Secondary | ICD-10-CM | POA: Diagnosis not present

## 2016-10-21 DIAGNOSIS — M545 Low back pain: Secondary | ICD-10-CM | POA: Diagnosis not present

## 2016-10-25 DIAGNOSIS — M545 Low back pain: Secondary | ICD-10-CM | POA: Diagnosis not present

## 2016-10-25 DIAGNOSIS — M5416 Radiculopathy, lumbar region: Secondary | ICD-10-CM | POA: Diagnosis not present

## 2016-11-08 DIAGNOSIS — M5416 Radiculopathy, lumbar region: Secondary | ICD-10-CM | POA: Diagnosis not present

## 2016-11-08 DIAGNOSIS — M545 Low back pain: Secondary | ICD-10-CM | POA: Diagnosis not present

## 2016-11-09 DIAGNOSIS — M545 Low back pain: Secondary | ICD-10-CM | POA: Diagnosis not present

## 2016-11-09 DIAGNOSIS — M5416 Radiculopathy, lumbar region: Secondary | ICD-10-CM | POA: Diagnosis not present

## 2016-11-12 DIAGNOSIS — E139 Other specified diabetes mellitus without complications: Secondary | ICD-10-CM | POA: Diagnosis not present

## 2016-11-12 DIAGNOSIS — J301 Allergic rhinitis due to pollen: Secondary | ICD-10-CM | POA: Diagnosis not present

## 2016-11-12 DIAGNOSIS — E039 Hypothyroidism, unspecified: Secondary | ICD-10-CM | POA: Diagnosis not present

## 2016-11-12 DIAGNOSIS — R42 Dizziness and giddiness: Secondary | ICD-10-CM | POA: Diagnosis not present

## 2016-11-15 ENCOUNTER — Encounter: Payer: Self-pay | Admitting: Gastroenterology

## 2016-11-18 DIAGNOSIS — M5416 Radiculopathy, lumbar region: Secondary | ICD-10-CM | POA: Diagnosis not present

## 2016-11-18 DIAGNOSIS — M545 Low back pain: Secondary | ICD-10-CM | POA: Diagnosis not present

## 2016-11-23 DIAGNOSIS — M545 Low back pain: Secondary | ICD-10-CM | POA: Diagnosis not present

## 2016-11-23 DIAGNOSIS — H903 Sensorineural hearing loss, bilateral: Secondary | ICD-10-CM | POA: Diagnosis not present

## 2016-11-23 DIAGNOSIS — M5416 Radiculopathy, lumbar region: Secondary | ICD-10-CM | POA: Diagnosis not present

## 2016-11-23 DIAGNOSIS — H6123 Impacted cerumen, bilateral: Secondary | ICD-10-CM | POA: Diagnosis not present

## 2016-11-29 ENCOUNTER — Encounter: Payer: Self-pay | Admitting: Gastroenterology

## 2016-11-29 ENCOUNTER — Ambulatory Visit (INDEPENDENT_AMBULATORY_CARE_PROVIDER_SITE_OTHER): Payer: BLUE CROSS/BLUE SHIELD | Admitting: Gastroenterology

## 2016-11-29 ENCOUNTER — Encounter (INDEPENDENT_AMBULATORY_CARE_PROVIDER_SITE_OTHER): Payer: Self-pay

## 2016-11-29 VITALS — BP 124/82 | HR 100 | Ht 64.25 in | Wt 153.2 lb

## 2016-11-29 DIAGNOSIS — R142 Eructation: Secondary | ICD-10-CM

## 2016-11-29 DIAGNOSIS — K219 Gastro-esophageal reflux disease without esophagitis: Secondary | ICD-10-CM | POA: Diagnosis not present

## 2016-11-29 DIAGNOSIS — R066 Hiccough: Secondary | ICD-10-CM | POA: Diagnosis not present

## 2016-11-29 DIAGNOSIS — R112 Nausea with vomiting, unspecified: Secondary | ICD-10-CM

## 2016-11-29 MED ORDER — LANSOPRAZOLE 30 MG PO CPDR: 30.0000 mg | DELAYED_RELEASE_CAPSULE | Freq: Two times a day (BID) | ORAL | 3 refills | Status: DC

## 2016-11-29 NOTE — Patient Instructions (Addendum)
If you are age 46 or older, your body mass index should be between 23-30. Your Body mass index is 26.1 kg/m. If this is out of the aforementioned range listed, please consider follow up with your Primary Care Provider.  If you are age 46 or younger, your body mass index should be between 19-25. Your Body mass index is 26.1 kg/m. If this is out of the aformentioned range listed, please consider follow up with your Primary Care Provider.   We have sent the following medications to your pharmacy for you to pick up at your convenience: Prevacid  Please follow up with Dr Russella DarStark in 4-6 weeks  Thank you for choosing [facility]

## 2016-11-29 NOTE — Progress Notes (Signed)
Reviewed and agree with initial management plan.  Jerah Esty T. Destynee Stringfellow, MD FACG 

## 2016-11-29 NOTE — Progress Notes (Addendum)
11/29/2016 Ashlee Dennis [id number] [birth year]   HISTORY OF PRESENT ILLNESS:  This is a 46 year old female who is new to our office and has been referred here by her PCP, Dr. Nehemiah Settle, for evaluation regarding her GERD symptoms.  She comes here today with complaints of intermittent hiccups, belching, nausea, random vomiting, and reflux. She tells me that she was diagnosed with reflux when she was 46 years old by a pediatric gastroenterologist. She seems to have seen several GI physicians over the years. She says that she used to see Dr. Laural Benes at [facility], had been seen at [facility] in the past, and she tells me that she saw Dr. Elnoria Howard apparently for colonoscopies. She tells me she had 3 attempted colonoscopies by Dr. Elnoria Howard but she was not able to tolerate the prep and did not have good preps as a result. She tells me that she thinks her last endoscopy was 17 years ago. She says that in the past her symptoms were well-controlled on Prevacid daily. Apparently she had been doing well for several years and was off all of her reflux medications. She has been back on Zantac 150 mg twice a day for at least the past year now. She says that her symptoms started back up again in July despite taking the Zantac. She says at the same time she was placed on glimepiride and thinks that it may have to do with some side effects of that as well. She reports random episodes of hiccups. She also has random episodes of vomiting, usually occurring in the morning. She says that she thinks some of that has to do with nerves. 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  Has not been back on any PPI.    Past Medical History:  Diagnosis Date  . Allergy   . Diabetes mellitus without complication (HCC)   . GERD (gastroesophageal reflux disease)   . Thyroid disease    Past Surgical History:  Procedure  Laterality Date  . ABDOMINAL HYSTERECTOMY    . BREAST SURGERY     Cancer  . KNEE ARTHROSCOPY    . Teeth Implants     x 2    reports that she has never smoked. She has never used smokeless tobacco. She reports that she does not drink alcohol or use drugs. family history includes Cancer (age of onset: 53) in her brother; Healthy in her mother; Skin cancer in her father; Thyroid disease in her sister and sister. Allergies  Allergen Reactions  . Asa [Aspirin] Other (See Comments)    GERD  . Aspartame And Phenylalanine     Joint aches  . Codeine Hives  . Dextroamphetamine Hives  . Erythromycin Hives  . Metformin And Related Hives  . Mint Chocolate Chip Flavor   . Oxycodone Hives  . Penicillins Hives  . Pennsaid [Diclofenac Sodium] Hives      Outpatient Encounter Prescriptions as of 11/29/2016  Medication Sig  . benzonatate (TESSALON) 100 MG capsule Take 1-2 capsules (100-200 mg total) by mouth 3 (three) times daily as needed.  . fexofenadine (ALLEGRA) 180 MG tablet Take 180 mg by mouth daily.  . fluticasone (FLONASE) 50 MCG/ACT nasal spray as needed.  Marland Kitchen glimepiride (AMARYL) 2 MG tablet daily.  Marland Kitchen levothyroxine (SYNTHROID, LEVOTHROID) 75 MCG tablet Take 75 mcg by mouth daily.  . montelukast (SINGULAIR)  10 MG tablet TAKE 1 TABLET (10 MG) BY MOUTH AT BEDTIME  . ranitidine (ZANTAC) 150 MG tablet TAKE 1 TABLET(150 MG) BY MOUTH TWICE DAILY   No facility-administered encounter medications on file as of 11/29/2016.      REVIEW OF SYSTEMS  : All other systems reviewed and negative except where noted in the History of Present Illness.   PHYSICAL EXAM: BP 124/82   Pulse 100   Ht 5' 4.25" (1.632 m)   Wt 153 lb 4 oz (69.5 kg)   BMI 26.10 kg/m  General: Well developed white female in no acute distress Head: Normocephalic and atraumatic Eyes:  Sclerae anicteric, conjunctiva pink. Ears: Normal auditory acuity Lungs: Clear throughout to auscultation Heart: Regular rate and  rhythm Abdomen: Soft, non-distended.  Normal bowel sounds.  Non-tender. Musculoskeletal: Symmetrical with no gross deformities  Skin: No lesions on visible extremities Extremities: No edema  Neurological: Alert oriented x 4, grossly non-focal Psychological:  Alert and cooperative. Normal mood and affect  ASSESSMENT AND PLAN: -46 year old female with complaints of hiccups, belching, GERD, nausea, vomiting:  Had good control of symptoms with prevacid in the past.  Currently only on zantac 150 mg BID.  I am going to have her discontinue the zantac and restart prevacid 30 mg BID.  We will plan to see her back in 4-6 weeks for an update on her symptoms.  If symptoms still present then may need EGD.  In the interim we will try to obtain her previous GI records from Dr. Elnoria HowardHung and Dr. Laural BenesJohnson.  **We received a fax back from Dr. Henriette CombsJohnson's office and they stated they did not have any records on the patient. We received only an office note from Dr. Haywood PaoHung's office stating that she never proceeded with colonoscopy (she says that she attempted prep 3 times but could not tolerate it).   CC:  Renford DillsPolite, Ronald, MD

## 2016-12-03 ENCOUNTER — Telehealth: Payer: Self-pay | Admitting: Gastroenterology

## 2016-12-03 NOTE — Telephone Encounter (Signed)
The pt has a long history of nausea and vomiting.  However, she seems to think this is worse since starting prevacid.  I have advised her to stop the prevacid for a few days and see if the nausea gets better.  She will call on Monday to update on her condition

## 2016-12-06 ENCOUNTER — Telehealth: Payer: Self-pay | Admitting: Gastroenterology

## 2016-12-06 NOTE — Telephone Encounter (Signed)
The pt was advised to keep her appt with Dr Russella DarStark and was placed on a wait list for cancellations.

## 2016-12-06 NOTE — Telephone Encounter (Signed)
Patient calling in regarding this.  °

## 2016-12-09 DIAGNOSIS — R110 Nausea: Secondary | ICD-10-CM | POA: Diagnosis not present

## 2016-12-09 DIAGNOSIS — K219 Gastro-esophageal reflux disease without esophagitis: Secondary | ICD-10-CM | POA: Diagnosis not present

## 2016-12-30 ENCOUNTER — Encounter: Payer: Self-pay | Admitting: Gastroenterology

## 2016-12-30 ENCOUNTER — Encounter (INDEPENDENT_AMBULATORY_CARE_PROVIDER_SITE_OTHER): Payer: Self-pay

## 2016-12-30 ENCOUNTER — Ambulatory Visit (INDEPENDENT_AMBULATORY_CARE_PROVIDER_SITE_OTHER): Payer: BLUE CROSS/BLUE SHIELD | Admitting: Gastroenterology

## 2016-12-30 VITALS — BP 150/90 | HR 80 | Ht 64.17 in | Wt 152.0 lb

## 2016-12-30 DIAGNOSIS — K219 Gastro-esophageal reflux disease without esophagitis: Secondary | ICD-10-CM | POA: Diagnosis not present

## 2016-12-30 NOTE — Progress Notes (Signed)
    History of Present Illness: This is a 46 year old female with returning for follow-up of GERD. See JZ office note from 11/29/2016. Patient was tried on Prevacid which did not improve her symptoms so she discontinued it. Her PCP prescribed pantoprazole 40 mg daily and her symptoms have substantially improved and almost completely resolved.  Current Medications, Allergies, Past Medical History, Past Surgical History, Family History and Social History were reviewed in [facility] electronic medical record.  Physical Exam: General: Well developed, well nourished, no acute distress Head: Normocephalic and atraumatic Eyes:  sclerae anicteric, EOMI Ears: Normal auditory acuity Mouth: No deformity or lesions Lungs: Clear throughout to auscultation Heart: Regular rate and rhythm; no murmurs, rubs or bruits Abdomen: Soft, non tender and non distended. No masses, hepatosplenomegaly or hernias noted. Normal Bowel sounds Musculoskeletal: Symmetrical with no gross deformities  Pulses:  Normal pulses noted Extremities: No clubbing, cyanosis, edema or deformities noted Neurological: Alert oriented x 4, grossly nonfocal Psychological:  Alert and cooperative. Normal mood and affect  Assessment and Recommendations:  1. GERD. Follow all standard antireflux measures. Avoid foods that trigger symptoms. Continue pantoprazole 40 mg daily long-term. If symptoms are not well controlled on a daily PPI consider EGD for further evaluation. Follow-up with PCP. GI follow-up as needed.

## 2016-12-30 NOTE — Patient Instructions (Signed)
Continue your pantoprazole daily.   Patient advised to avoid spicy, acidic, citrus, chocolate, mints, fruit and fruit juices.  Limit the intake of caffeine, alcohol and Soda.  Don't exercise too soon after eating.  Don't lie down within 3-4 hours of eating.  Elevate the head of your bed.  Normal BMI (Body Mass Index- based on height and weight) is between 19 and 25. Your BMI today is Body mass index is 25.95 kg/m. Marland Kitchen. Please consider follow up  regarding your BMI with your Primary Care Provider.  Thank you for choosing me and [facility].  Venita LickMalcolm T. Pleas KochStark, Jr., MD., Clementeen GrahamFACG

## 2017-02-25 ENCOUNTER — Ambulatory Visit (INDEPENDENT_AMBULATORY_CARE_PROVIDER_SITE_OTHER): Payer: BLUE CROSS/BLUE SHIELD | Admitting: Family Medicine

## 2017-02-25 ENCOUNTER — Encounter: Payer: Self-pay | Admitting: Family Medicine

## 2017-02-25 VITALS — BP 144/80 | HR 90 | Temp 98.7°F | Resp 18 | Ht 63.58 in | Wt 151.0 lb

## 2017-02-25 DIAGNOSIS — R21 Rash and other nonspecific skin eruption: Secondary | ICD-10-CM

## 2017-02-25 DIAGNOSIS — W57XXXA Bitten or stung by nonvenomous insect and other nonvenomous arthropods, initial encounter: Secondary | ICD-10-CM

## 2017-02-25 MED ORDER — TRIAMCINOLONE ACETONIDE 0.1 % EX CREA: 1.0000 "application " | TOPICAL_CREAM | Freq: Two times a day (BID) | CUTANEOUS | 0 refills | Status: DC

## 2017-02-25 NOTE — Patient Instructions (Addendum)
Use the triamcinolone cream 2-4 times a day for a few days on the bites.  Recommend taking Zyrtec (cetirizine) once or twice daily as needed for the itching.  Return if it is spreading.  IF you received an x-ray today, you will receive an invoice from [facility]. Please contact [facility] at [phone] with questions or concerns regarding your invoice.   IF you received labwork today, you will receive an invoice from [facility]. Please contact [facility] at [phone] with questions or concerns regarding your invoice.   Our billing staff will not be able to assist you with questions regarding bills from these companies.  You will be contacted with the lab results as soon as they are available. The fastest way to get your results is to activate your My Chart account. Instructions are located on the last page of this paperwork. If you have not heard from us regarding the results in 2 weeks, please contact this office.

## 2017-02-25 NOTE — Progress Notes (Signed)
Patient ID: Burnett Correntemy Trotta, female    DOB: 10/26/1970  Age: 46 y.o. MRN: [id number]  Chief Complaint  Patient presents with  . Insect Bite    Subjective:   46 year old lady who has bites on the back of her leg. She was helping catch a stray dog last night, and wonders whether she got bit with something from the dog. The dog had a lot of maggots on it and ended up having to be put down today. Not been out in the bushes. She had long pants on. Has not seen any insects on her. She does not have any pain from the rash.  Current allergies, medications, problem list, past/family and social histories reviewed.  Objective:  BP (!) 144/80   Pulse 90   Temp 98.7 F (37.1 C) (Oral)   Resp 18   Ht 5' 3.58" (1.615 m)   Wt 151 lb (68.5 kg)   SpO2 98%   BMI 26.26 kg/m   A area about 4 years in diameter with half dozen bites in it just above the left popliteal fossa. Each of the individual red spots or little greater than a centimeter in diameter. The itch quite a lot.  Assessment & Plan:   Assessment: 1. Insect bite, initial encounter       Plan: It must have been an insect bite. I wonder if something got up her pants leg like an aunt and got her multiple places.  No orders of the defined types were placed in this encounter.   Meds ordered this encounter  Medications  . triamcinolone cream (KENALOG) 0.1 %    Sig: Apply 1 application topically 2 (two) times daily.    Dispense:  30 g    Refill:  0         Patient Instructions   Use the triamcinolone cream 2-4 times a day for a few days on the bites.  Recommend taking Zyrtec (cetirizine) once or twice daily as needed for the itching.  Return if it is spreading.  IF you received an x-ray today, you will receive an invoice from [facility]. Please contact [facility] at [phone] with questions or concerns regarding your invoice.   IF you received labwork today, you will receive an invoice from [facility].  Please contact [facility] at [phone] with questions or concerns regarding your invoice.   Our billing staff will not be able to assist you with questions regarding bills from these companies.  You will be contacted with the lab results as soon as they are available. The fastest way to get your results is to activate your My Chart account. Instructions are located on the last page of this paperwork. If you have not heard from us regarding the results in 2 weeks, please contact this office.         No Follow-up on file.   HOPPER,DAVID, MD 02/25/2017

## 2017-03-01 DIAGNOSIS — H6123 Impacted cerumen, bilateral: Secondary | ICD-10-CM | POA: Diagnosis not present

## 2017-03-01 DIAGNOSIS — H903 Sensorineural hearing loss, bilateral: Secondary | ICD-10-CM | POA: Diagnosis not present

## 2017-04-01 DIAGNOSIS — E039 Hypothyroidism, unspecified: Secondary | ICD-10-CM | POA: Diagnosis not present

## 2017-04-01 DIAGNOSIS — E1165 Type 2 diabetes mellitus with hyperglycemia: Secondary | ICD-10-CM | POA: Diagnosis not present

## 2017-04-01 DIAGNOSIS — E119 Type 2 diabetes mellitus without complications: Secondary | ICD-10-CM | POA: Diagnosis not present

## 2017-04-01 DIAGNOSIS — R030 Elevated blood-pressure reading, without diagnosis of hypertension: Secondary | ICD-10-CM | POA: Diagnosis not present

## 2017-04-21 DIAGNOSIS — Z23 Encounter for immunization: Secondary | ICD-10-CM | POA: Diagnosis not present

## 2017-06-08 DIAGNOSIS — G5761 Lesion of plantar nerve, right lower limb: Secondary | ICD-10-CM | POA: Diagnosis not present

## 2017-06-15 DIAGNOSIS — M79671 Pain in right foot: Secondary | ICD-10-CM | POA: Diagnosis not present

## 2017-06-15 DIAGNOSIS — M2041 Other hammer toe(s) (acquired), right foot: Secondary | ICD-10-CM | POA: Diagnosis not present

## 2017-06-15 DIAGNOSIS — E139 Other specified diabetes mellitus without complications: Secondary | ICD-10-CM | POA: Diagnosis not present

## 2017-06-15 DIAGNOSIS — M7751 Other enthesopathy of right foot: Secondary | ICD-10-CM | POA: Diagnosis not present

## 2017-06-23 DIAGNOSIS — H6123 Impacted cerumen, bilateral: Secondary | ICD-10-CM | POA: Diagnosis not present

## 2017-06-23 DIAGNOSIS — H903 Sensorineural hearing loss, bilateral: Secondary | ICD-10-CM | POA: Diagnosis not present

## 2017-07-01 ENCOUNTER — Encounter: Payer: Self-pay | Admitting: Podiatry

## 2017-07-01 ENCOUNTER — Ambulatory Visit (INDEPENDENT_AMBULATORY_CARE_PROVIDER_SITE_OTHER): Payer: BLUE CROSS/BLUE SHIELD

## 2017-07-01 ENCOUNTER — Ambulatory Visit (INDEPENDENT_AMBULATORY_CARE_PROVIDER_SITE_OTHER): Payer: BLUE CROSS/BLUE SHIELD | Admitting: Podiatry

## 2017-07-01 VITALS — BP 123/83 | HR 93 | Resp 18

## 2017-07-01 DIAGNOSIS — F419 Anxiety disorder, unspecified: Secondary | ICD-10-CM | POA: Insufficient documentation

## 2017-07-01 DIAGNOSIS — M2041 Other hammer toe(s) (acquired), right foot: Secondary | ICD-10-CM | POA: Insufficient documentation

## 2017-07-01 DIAGNOSIS — M7741 Metatarsalgia, right foot: Secondary | ICD-10-CM

## 2017-07-01 NOTE — Patient Instructions (Signed)
Hammer Toe Hammer toe is a change in the shape (a deformity) of your second, third, or fourth toe. The deformity causes the middle joint of your toe to stay bent. This causes pain, especially when you are wearing shoes. Hammer toe starts gradually. At first, the toe can be straightened. Gradually over time, the deformity becomes stiff and permanent. Early treatments to keep the toe straight may relieve pain. As the deformity becomes stiff and permanent, surgery may be needed to straighten the toe. What are the causes? Hammer toe is caused by abnormal bending of the toe joint that is closest to your foot. It happens gradually over time. This pulls on the muscles and connections (tendons) of the toe joint, making them weak and stiff. It is often related to wearing shoes that are too short or narrow and do not let your toes straighten. What increases the risk? You may be at greater risk for hammer toe if you:  Are female.  Are older.  Wear shoes that are too small.  Wear high-heeled shoes that pinch your toes.  Are a ballet dancer.  Have a second toe that is longer than your big toe (first toe).  Injure your foot or toe.  Have arthritis.  Have a family history of hammer toe.  Have a nerve or muscle disorder.  What are the signs or symptoms? The main symptoms of this condition are pain and deformity of the toe. The pain is worse when wearing shoes, walking, or running. Other symptoms may include:  Corns or calluses over the bent part of the toe or between the toes.  Redness and a burning feeling on the toe.  An open sore that forms on the top of the toe.  Not being able to straighten the toe.  How is this diagnosed? This condition is diagnosed based on your symptoms and a physical exam. During the exam, your health care provider will try to straighten your toe to see how stiff the deformity is. You may also have tests, such as:  A blood test to check for rheumatoid  arthritis.  An X-ray to show how severe the deformity is.  How is this treated? Treatment for this condition will depend on how stiff the deformity is. Surgery is often needed. However, sometimes a hammer toe can be straightened without surgery. Treatments that do not involve surgery include:  Taping the toe into a straightened position.  Using pads and cushions to protect the toe (orthotics).  Wearing shoes that provide enough room for the toes.  Doing toe-stretching exercises at home.  Taking an NSAID to reduce pain and swelling.  If these treatments do not help or the toe cannot be straightened, surgery is the next option. The most common surgeries used to straighten a hammer toe include:  Arthroplasty. In this procedure, part of the joint is removed, and that allows the toe to straighten.  Fusion. In this procedure, cartilage between the two bones of the joint is taken out and the bones are fused together into one longer bone.  Implantation. In this procedure, part of the bone is removed and replaced with an implant to let the toe move again.  Flexor tendon transfer. In this procedure, the tendons that curl the toes down (flexor tendons) are repositioned.  Follow these instructions at home:  Take over-the-counter and prescription medicines only as told by your health care provider.  Do toe straightening and stretching exercises as told by your health care provider.  Keep all   follow-up visits as told by your health care provider. This is important. How is this prevented?  Wear shoes that give your toes enough room and do not cause pain.  Do not wear high-heeled shoes. Contact a health care provider if:  Your pain gets worse.  Your toe becomes red or swollen.  You develop an open sore on your toe. This information is not intended to replace advice given to you by your health care provider. Make sure you discuss any questions you have with your health care  provider. Document Released: 09/24/2000 Document Revised: 04/16/2016 Document Reviewed: 01/21/2016 Elsevier Interactive Patient Education  2018 Elsevier Inc.  

## 2017-07-01 NOTE — Progress Notes (Signed)
   Subjective:    Patient ID: Ashlee Dennis, female    DOB: 12-Feb-1971, 46 y.o.   MRN: [id number]  HPI  46 year old female presents the also concerns of hammertoes to her right foot most notably her right second toe. She states that she gets pain on a daily basis with shoes. She also gets some pain to the ball of her feet as well the right foot. She has a history of fracture the right second toe several years ago after dog stepped on it. She said no recent treatment for the hammertoes with pain at the ball of her foot. She is diabetic and her last A1c was 6.2. She has no other concerns today.   Review of Systems  All other systems reviewed and are negative.      Objective:   Physical Exam General: AAO x3, NAD  Dermatological: Skin is warm, dry and supple bilateral. Nails x 10 are well manicured; remaining integument appears unremarkable at this time. There are no open sores, no preulcerative lesions, no rash or signs of infection present.  Vascular: Dorsalis Pedis artery and Posterior Tibial artery pedal pulses are 2/4 bilateral with immedate capillary fill time. There is no pain with calf compression, swelling, warmth, erythema.   Neruologic: Grossly intact via light touch bilateral.  Protective threshold with Semmes Wienstein monofilament intact to all pedal sites bilateral.   Musculoskeletal: Semirigid hammertoe contractures are present the right second through fourth digit there is tenderness to the right second toe on the hammertoe. There is also some mild diffuse tenderness submetatarsal 2 through 4 on the right foot. There is no other area pinpoint bony tenderness or pain the vibratory sensation. There is no overlying edema, erythema, increase in warmth. There is some slight erythema to the dorsal aspect of the right PIPJ on the second toe amputation site issues. There is no skin breakdown identified.  Gait: Unassisted, Nonantalgic.      Assessment & Plan:  46 year old female  symptomatic hammertoe, metatarsalgia right foot -Treatment options discussed including all alternatives, risks, and complications -Etiology of symptoms were discussed -X-rays were obtained and reviewed with the patient. Hammertoes present. There is no evidence of acute fracture or stress fracture identified today. -We discussed with conservative as well as surgical options for her toes. She is not had any conservative treatment at this point. We discussed the change in shoe, extra depth shoe as well as change of the material issues. Dispensed a toe crest offloading pads as well as metatarsal pads for the metatarsalgia. We also discussed surgical intervention if symptoms continue. We discussed hammertoe repair this will not likely limit the symptoms to the submetatarsal area and long-term he needs an orthotic or continue the offloading pads should the symptoms continue. She'll consider her options of symptoms continue to do surgery in December 2018. She was provided information on how to call our office to schedule the surgery. I need to see her back prior surgery for consultation and paperwork.  Ovid Curd, DPM

## 2017-07-05 ENCOUNTER — Telehealth: Payer: Self-pay | Admitting: *Deleted

## 2017-07-05 NOTE — Telephone Encounter (Signed)
"  I'm trying to reach you.  I saw Dr. Ovid Curd and I have a question or two.  Please give me a call back."

## 2017-07-06 NOTE — Telephone Encounter (Signed)
"  I'm calling to schedule my surgery with Dr. Ardelle Anton."  Have you signed consent forms?  "No, I have not done any paperwork."  I can put you down tentatively.  Do you have a date you would like to schedule it?  "Yes, I'd like to do it on December 19."  You will need to come in to see Dr. Ardelle Anton for a consultation.  Would you like me to transfer you to a scheduler so you can make an appointment with Dr. Ardelle Anton?  "Yes, that will be fine."  I transferred her to Marylene Land so she could make an appointment.

## 2017-07-19 ENCOUNTER — Ambulatory Visit (INDEPENDENT_AMBULATORY_CARE_PROVIDER_SITE_OTHER): Payer: BLUE CROSS/BLUE SHIELD | Admitting: Podiatry

## 2017-07-19 ENCOUNTER — Encounter: Payer: Self-pay | Admitting: Podiatry

## 2017-07-19 DIAGNOSIS — M7741 Metatarsalgia, right foot: Secondary | ICD-10-CM

## 2017-07-19 DIAGNOSIS — M2041 Other hammer toe(s) (acquired), right foot: Secondary | ICD-10-CM | POA: Diagnosis not present

## 2017-07-19 DIAGNOSIS — E119 Type 2 diabetes mellitus without complications: Secondary | ICD-10-CM | POA: Diagnosis not present

## 2017-07-19 NOTE — Patient Instructions (Signed)

## 2017-07-20 NOTE — Progress Notes (Signed)
Subjective: Ashlee Dennis presents the office they for surgical consultation due to hammertoe pain on the right foot 2 through 5. She points to the top of the toes on the distal aspect of the toe which is the majority symptoms. Although the second toe is the worst she is getting irritation in all the toes inside of her shoes. She has purchased new shoes which has not been helping. She also has tried often has to be significant improvement at this point she wishes to proceed with surgical intervention. She also gets painful ball of her foot at times but the majority for pain appears to be to the toes. She denies any numbness or tingling to her toes or any sharp shooting pains. Denies any systemic complaints such as fevers, chills, nausea, vomiting. No acute changes since last appointment, and no other complaints at this time.   Last A1c was 7.1  Denies any history of any blood clots or bleeding disorders. She is currently not taking any blood thinners. She denies any history of slow healing wound.  Past Medical History:  Diagnosis Date  . Allergy   . Diabetes mellitus without complication (HCC)   . GERD (gastroesophageal reflux disease)   . Thyroid disease     Past Surgical History:  Procedure Laterality Date  . ABDOMINAL HYSTERECTOMY    . BREAST SURGERY     Cancer  . KNEE ARTHROSCOPY    . Teeth Implants     x 2     Current Outpatient Prescriptions:  .  fexofenadine (ALLEGRA) 180 MG tablet, Take 180 mg by mouth daily., Disp: , Rfl:  .  fluticasone (FLONASE) 50 MCG/ACT nasal spray, as needed., Disp: , Rfl: 3 .  glimepiride (AMARYL) 2 MG tablet, daily., Disp: , Rfl: 12 .  levothyroxine (SYNTHROID, LEVOTHROID) 75 MCG tablet, Take 75 mcg by mouth daily., Disp: , Rfl:  .  montelukast (SINGULAIR) 10 MG tablet, TAKE 1 TABLET (10 MG) BY MOUTH AT BEDTIME, Disp: 30 tablet, Rfl: 0 .  pantoprazole (PROTONIX) 40 MG tablet, Take 1 tablet by mouth daily., Disp: , Rfl: 1 .  triamcinolone cream (KENALOG)  0.1 %, Apply 1 application topically 2 (two) times daily., Disp: 30 g, Rfl: 0  Allergies  Allergen Reactions  . Asa [Aspirin] Other (See Comments)    GERD  . Aspartame And Phenylalanine     Joint aches  . Codeine Hives  . Dextroamphetamine Hives  . Erythromycin Hives  . Metformin And Related Hives  . Mint Chocolate Chip Flavor   . Oxycodone Hives  . Penicillins Hives  . Pennsaid [Diclofenac Sodium] Hives    Social History   Social History  . Marital status: Significant Other    Spouse name: N/A  . Number of children: 0  . Years of education: college   Occupational History  . substitute teacher [employer]   Social History Main Topics  . Smoking status: Never Smoker  . Smokeless tobacco: Never Used  . Alcohol use No  . Drug use: No  . Sexual activity: Yes    Partners: Male    Birth control/ protection: Condom   Other Topics Concern  . Not on file   Social History Narrative   Lives with her boyfriend.   Right-handed.   2-3 cups caffeine per day.     Objective: AAO x3, NAD DP/PT pulses palpable bilaterally, CRT less than 3 seconds Semirigid hammertoe contractures are present on digits 2 through 4 on the right foot has adductovarus of the  fifth digit. There is tenderness upon trying to straighten the toes no tenderness on the PIPJ. There is mild erythema to the PIPJ of digits 2 through 5 on the right foot from irritation tennis shoes. There is also prominent metatarsal heads plantarly with atrophy of the fat pad. Mild diffuse tenderness submetatarsal area. There is no palpable neuroma identified at this time. Is no pain along the metatarsals other areas of the foot.  No open lesions or pre-ulcerative lesions.  No pain with calf compression, swelling, warmth, erythema  Assessment: Symptomatic hammertoe deformity 2 through 5 on the right foot, metatarsalgia  Plan: -All treatment options discussed with the patient including all alternatives, risks, complications.   -X-rays were reviewed with the patient as well as her chart. -At this point we discussed both conservative as well as surgical treatment options. At this point she would have to go to proceed with surgical intervention due to the painful hammertoes on the right foot 2 through 5. I discussed with her PIPJ arthrodesis with K wire fixation of the digits (no K wire to the fifth toe). I discussed the surgery as well as the postoperative course as well as complications. -The incision placement as well as the postoperative course was discussed with the patient. I discussed risks of the surgery which include, but not limited to, infection, bleeding, pain, swelling, need for further surgery, delayed or nonhealing, painful or ugly scar, numbness or sensation changes, over/under correction, recurrence, transfer lesions, further deformity, hardware failure, further deformity of the toes, DVT/PE, loss of toe/foot. Patient understands these risks and wishes to proceed with surgery. The surgical consent was reviewed with the patient all 3 pages were signed. No promises or guarantees were given to the outcome of the procedure. All questions were answered to the best of my ability. Before the surgery the patient was encouraged to call the office if there is any further questions. The surgery will be performed at the [facility] on an outpatient basis. -We discussed that the hammertoe surgery may not eliminate this metatarsalgia type pain and long-term she is still likely need an orthotic offloading pads this area. She understands this. -Patient encouraged to call the office with any questions, concerns, change in symptoms.   Ovid Curd, DPM

## 2017-07-28 ENCOUNTER — Telehealth: Payer: Self-pay | Admitting: *Deleted

## 2017-07-28 NOTE — Telephone Encounter (Signed)
"  This is Ashlee CorrenteAmy Dennis.  I'm trying to reach you, Ashlee Dennis."

## 2017-07-28 NOTE — Telephone Encounter (Signed)
"  I have a question for you about Dr. Gabriel RungWagoner's schedule."

## 2017-07-29 NOTE — Telephone Encounter (Signed)
I'm returning your call.  How can I help you?  "Does Dr. Ardelle AntonWagoner have any surgery time available on January 2?"  Yes, he does.  "I'd like to reschedule my surgery from December 19 to January 2.  I want to wait until after the holidays so I can enjoy them."

## 2017-08-04 IMAGING — MR MR LUMBAR SPINE W/O CM
4 of 5 series · 27 of 48 positions shown · non-contrast
Comparison: None.

CLINICAL DATA: Low back pain for 2 months most notable toward the
left buttock. No recent injury.

EXAM:
MRI LUMBAR SPINE WITHOUT CONTRAST
TECHNIQUE: Multiplanar, multisequence MR imaging of the lumbar spine was
performed. No intravenous contrast was administered.

[Series 3: T2 · sagittal · 4.0mm · 0.55mm/px · 5 of 12 slices shown (1 of 2)]
[im 1/12]
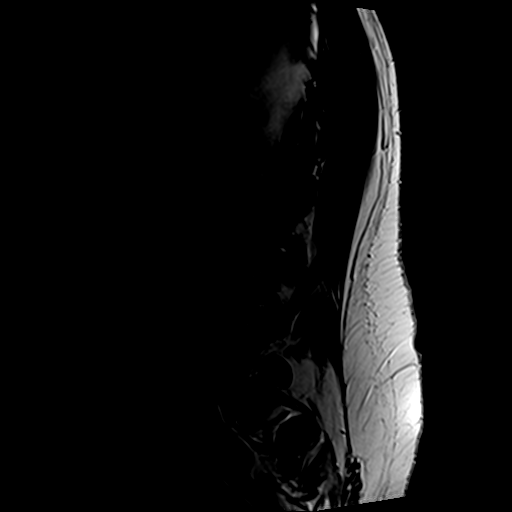
[im 3/12]
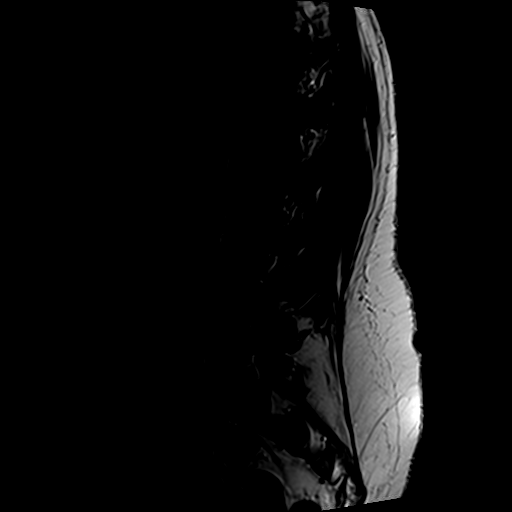
[im 6/12]
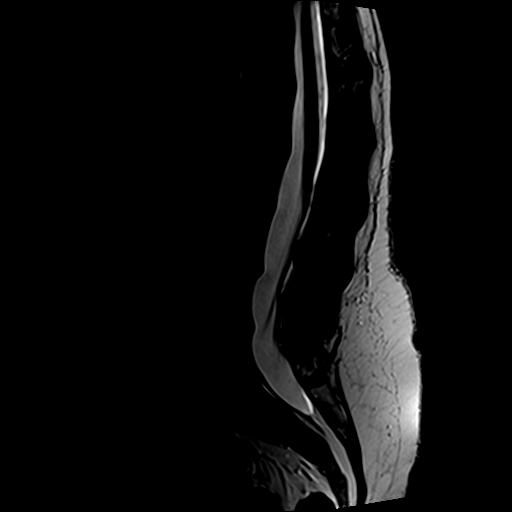
[im 9/12]
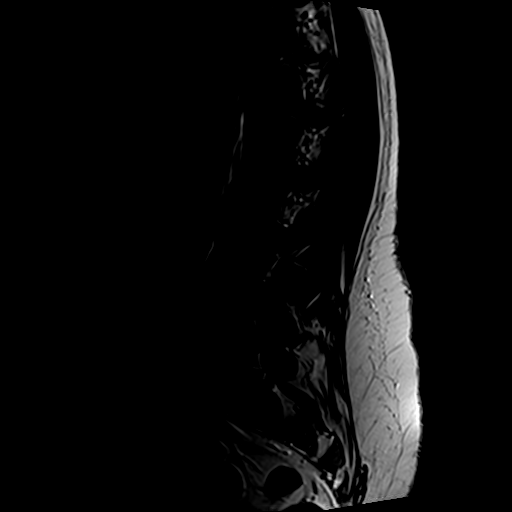
[im 12/12]
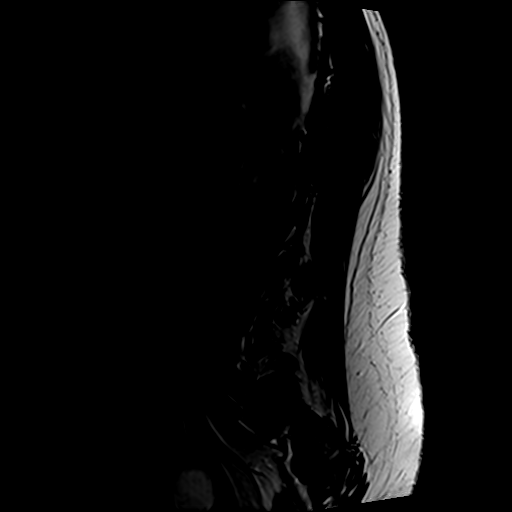

[Series 4: T1 · sagittal · 4.0mm · 0.55mm/px · 5 of 12 slices shown (1 of 2)]
[im 1/12]
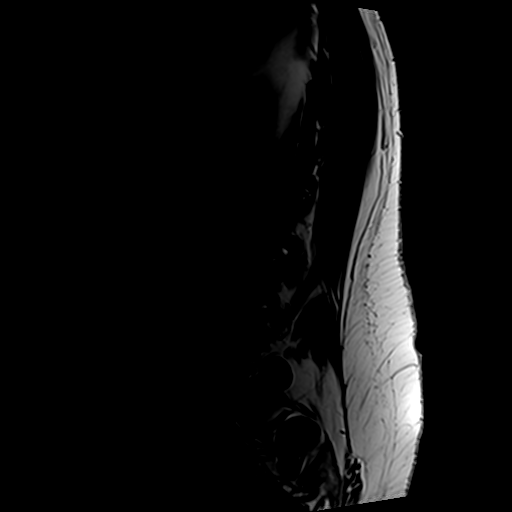
[im 3/12]
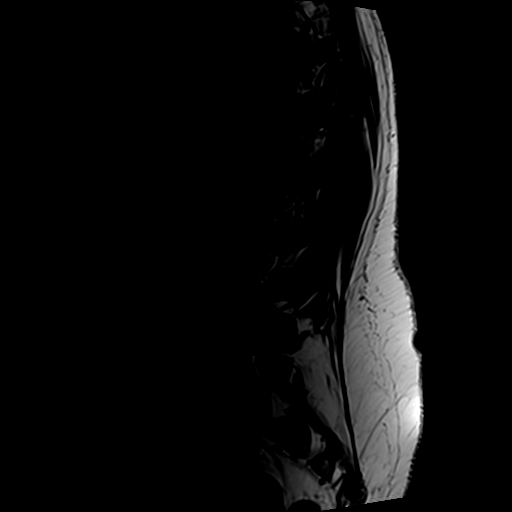
[im 6/12]
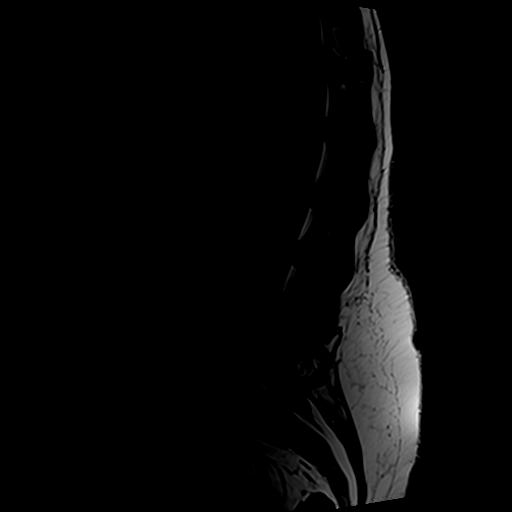
[im 9/12]
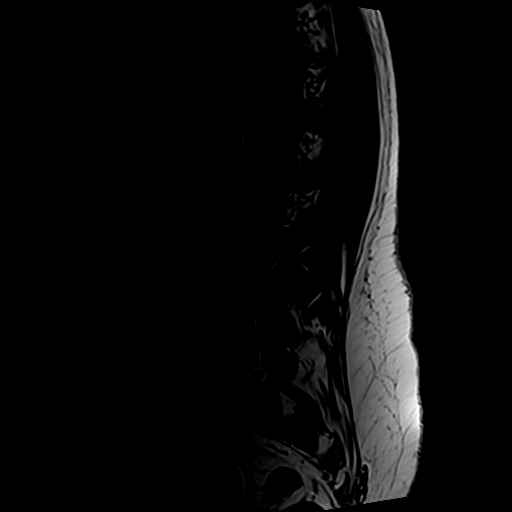
[im 12/12]
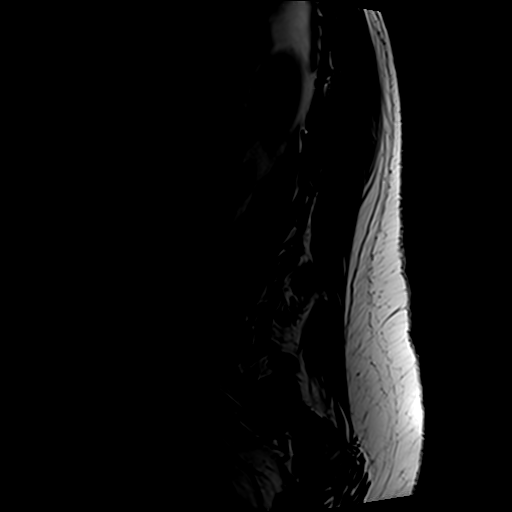

[Series 6: T2 · axial · 4.0mm · 0.70mm/px · z∈[-106,+59]mm · 10 of 34 slices shown (2 of 2)]
[im 3/34]
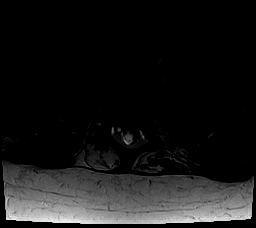
[im 5/34]
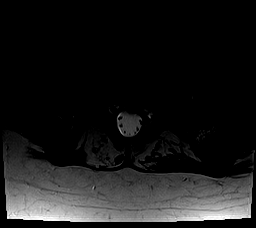
[im 7/34]
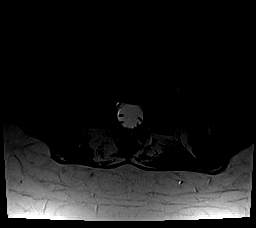
[im 12/34]
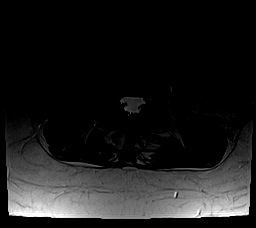
[im 16/34]
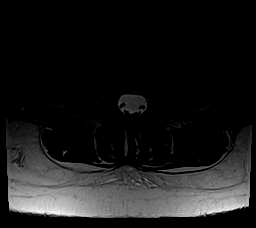
[im 18/34]
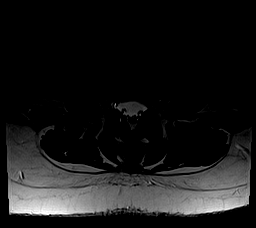
[im 20/34]
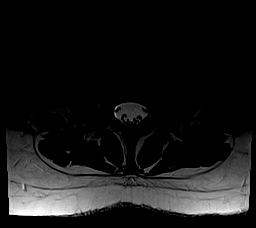
[im 25/34]
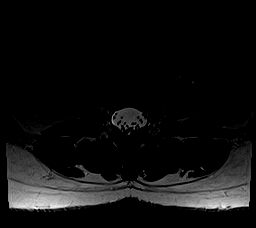
[im 29/34]
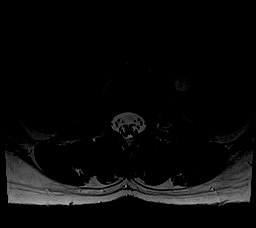
[im 34/34]
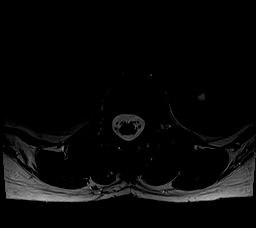

[Series 7: T1 · axial · 4.0mm · 0.35mm/px · z∈[-106,+33]mm · 7 of 34 slices shown (2 of 2)]
[im 3/34]
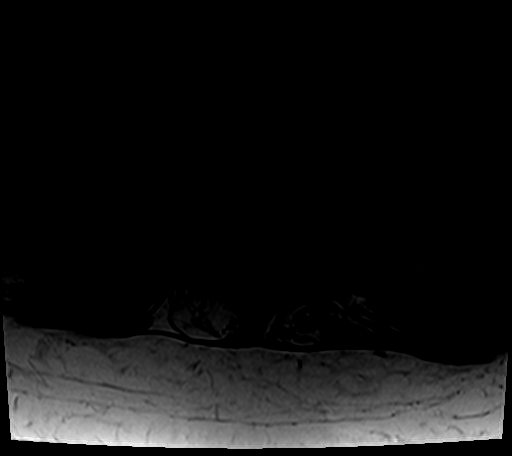
[im 5/34]
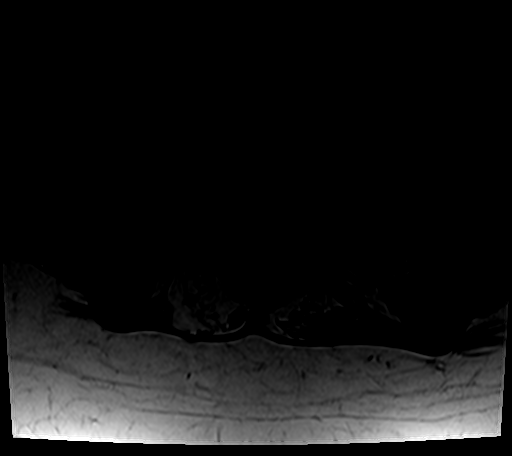
[im 7/34]
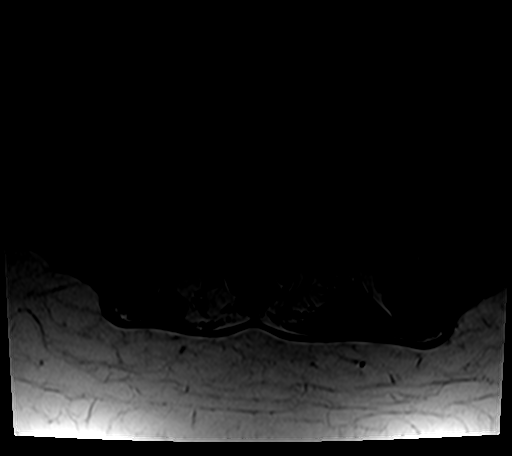
[im 12/34]
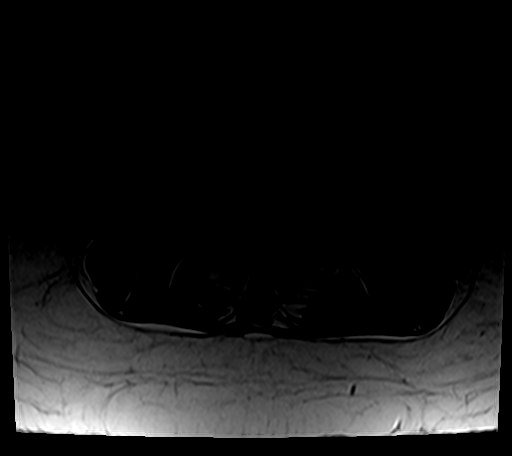
[im 16/34]
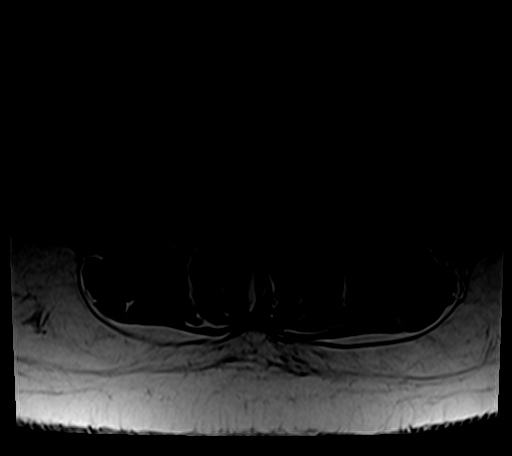
[im 18/34]
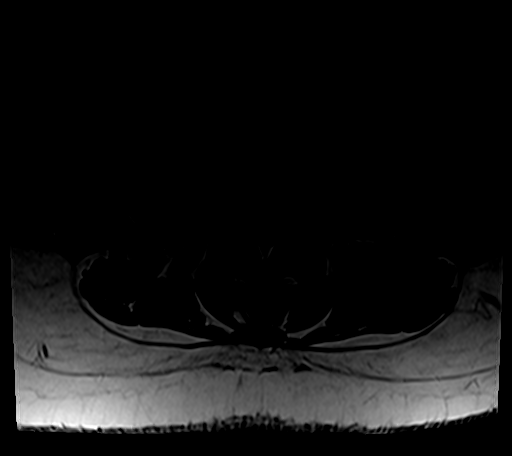
[im 29/34]
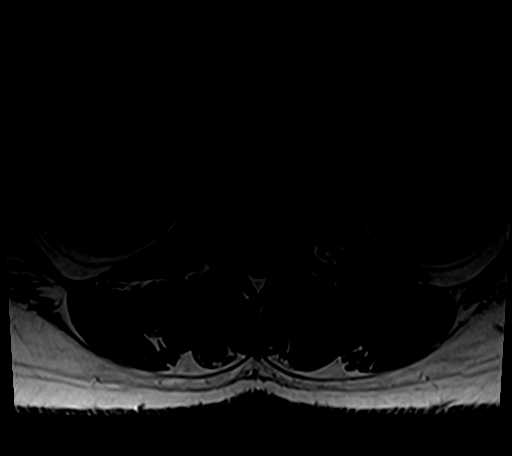

[27 of 48 positions shown; findings below may reference images not displayed]

FINDINGS: Segmentation:  Standard.

Alignment:  Maintained.

Vertebrae: No fracture or worrisome lesion. Small hemangioma at T12
noted.

Conus medullaris: Extends to the L2 level and appears normal.

Paraspinal and other soft tissues: Negative.

Disc levels:

T11-12 and T12-L1 are imaged in the sagittal plane only and
negative.

L1-2:  Negative.

L2-3:  Negative.

L3-4: Minimal disc bulge. The central canal and foramina are widely
patent.

L4-5: Mild to moderate facet degenerative change and a very shallow
disc bulge. The central canal and foramina are widely patent.

L5-S1: Shallow disc bulge. The central canal and foramina are widely
patent.
IMPRESSION: Mild lower lumbar degenerative disease as described above. The
central canal and foramina are widely patent at all levels.

## 2017-08-17 DIAGNOSIS — E139 Other specified diabetes mellitus without complications: Secondary | ICD-10-CM | POA: Diagnosis not present

## 2017-08-17 DIAGNOSIS — M79671 Pain in right foot: Secondary | ICD-10-CM | POA: Diagnosis not present

## 2017-08-17 DIAGNOSIS — M2041 Other hammer toe(s) (acquired), right foot: Secondary | ICD-10-CM | POA: Diagnosis not present

## 2017-08-24 DIAGNOSIS — J310 Chronic rhinitis: Secondary | ICD-10-CM | POA: Diagnosis not present

## 2017-09-21 DIAGNOSIS — Z803 Family history of malignant neoplasm of breast: Secondary | ICD-10-CM | POA: Diagnosis not present

## 2017-09-21 DIAGNOSIS — Z1231 Encounter for screening mammogram for malignant neoplasm of breast: Secondary | ICD-10-CM | POA: Diagnosis not present

## 2017-10-17 ENCOUNTER — Encounter: Payer: BLUE CROSS/BLUE SHIELD | Admitting: Podiatry

## 2017-10-21 DIAGNOSIS — E039 Hypothyroidism, unspecified: Secondary | ICD-10-CM | POA: Diagnosis not present

## 2017-10-21 DIAGNOSIS — Z13220 Encounter for screening for lipoid disorders: Secondary | ICD-10-CM | POA: Diagnosis not present

## 2017-10-21 DIAGNOSIS — K219 Gastro-esophageal reflux disease without esophagitis: Secondary | ICD-10-CM | POA: Diagnosis not present

## 2017-10-21 DIAGNOSIS — E119 Type 2 diabetes mellitus without complications: Secondary | ICD-10-CM | POA: Diagnosis not present

## 2017-10-21 DIAGNOSIS — Z0000 Encounter for general adult medical examination without abnormal findings: Secondary | ICD-10-CM | POA: Diagnosis not present

## 2017-10-24 ENCOUNTER — Encounter: Payer: BLUE CROSS/BLUE SHIELD | Admitting: Podiatry

## 2017-10-27 DIAGNOSIS — M79671 Pain in right foot: Secondary | ICD-10-CM | POA: Diagnosis not present

## 2017-10-27 DIAGNOSIS — M2041 Other hammer toe(s) (acquired), right foot: Secondary | ICD-10-CM | POA: Diagnosis not present

## 2017-10-27 DIAGNOSIS — E139 Other specified diabetes mellitus without complications: Secondary | ICD-10-CM | POA: Diagnosis not present

## 2017-11-03 DIAGNOSIS — H5213 Myopia, bilateral: Secondary | ICD-10-CM | POA: Diagnosis not present

## 2017-11-08 DIAGNOSIS — L812 Freckles: Secondary | ICD-10-CM | POA: Diagnosis not present

## 2017-11-08 DIAGNOSIS — D2361 Other benign neoplasm of skin of right upper limb, including shoulder: Secondary | ICD-10-CM | POA: Diagnosis not present

## 2017-11-16 ENCOUNTER — Emergency Department (HOSPITAL_COMMUNITY): Payer: BLUE CROSS/BLUE SHIELD

## 2017-11-16 ENCOUNTER — Encounter (HOSPITAL_COMMUNITY): Payer: Self-pay | Admitting: Radiology

## 2017-11-16 DIAGNOSIS — E119 Type 2 diabetes mellitus without complications: Secondary | ICD-10-CM | POA: Insufficient documentation

## 2017-11-16 DIAGNOSIS — Z7984 Long term (current) use of oral hypoglycemic drugs: Secondary | ICD-10-CM | POA: Diagnosis not present

## 2017-11-16 DIAGNOSIS — R000 Tachycardia, unspecified: Secondary | ICD-10-CM | POA: Diagnosis not present

## 2017-11-16 DIAGNOSIS — R202 Paresthesia of skin: Secondary | ICD-10-CM | POA: Diagnosis not present

## 2017-11-16 DIAGNOSIS — Z79899 Other long term (current) drug therapy: Secondary | ICD-10-CM | POA: Diagnosis not present

## 2017-11-16 DIAGNOSIS — R064 Hyperventilation: Secondary | ICD-10-CM | POA: Diagnosis not present

## 2017-11-16 DIAGNOSIS — R51 Headache: Secondary | ICD-10-CM | POA: Insufficient documentation

## 2017-11-16 DIAGNOSIS — R200 Anesthesia of skin: Secondary | ICD-10-CM | POA: Diagnosis not present

## 2017-11-16 DIAGNOSIS — E039 Hypothyroidism, unspecified: Secondary | ICD-10-CM | POA: Diagnosis not present

## 2017-11-16 DIAGNOSIS — I6789 Other cerebrovascular disease: Secondary | ICD-10-CM | POA: Diagnosis not present

## 2017-11-16 LAB — DIFFERENTIAL
Basophils Absolute: 0 10*3/uL (ref 0.0–0.1)
Basophils Relative: 0 %
Eosinophils Absolute: 0 10*3/uL (ref 0.0–0.7)
Eosinophils Relative: 0 %
LYMPHS PCT: 9 %
Lymphs Abs: 1.4 10*3/uL (ref 0.7–4.0)
MONO ABS: 0.7 10*3/uL (ref 0.1–1.0)
MONOS PCT: 5 %
NEUTROS ABS: 12.5 10*3/uL — AB (ref 1.7–7.7)
Neutrophils Relative %: 86 %

## 2017-11-16 LAB — I-STAT CHEM 8, ED
BUN: 13 mg/dL (ref 6–20)
CALCIUM ION: 1.09 mmol/L — AB (ref 1.15–1.40)
CHLORIDE: 100 mmol/L — AB (ref 101–111)
Creatinine, Ser: 0.5 mg/dL (ref 0.44–1.00)
GLUCOSE: 235 mg/dL — AB (ref 65–99)
HCT: 44 % (ref 36.0–46.0)
HEMOGLOBIN: 15 g/dL (ref 12.0–15.0)
Potassium: 3.4 mmol/L — ABNORMAL LOW (ref 3.5–5.1)
Sodium: 138 mmol/L (ref 135–145)
TCO2: 23 mmol/L (ref 22–32)

## 2017-11-16 LAB — APTT: aPTT: 31 seconds (ref 24–36)

## 2017-11-16 LAB — CBC
HEMATOCRIT: 42.9 % (ref 36.0–46.0)
Hemoglobin: 14.8 g/dL (ref 12.0–15.0)
MCH: 29.6 pg (ref 26.0–34.0)
MCHC: 34.5 g/dL (ref 30.0–36.0)
MCV: 85.8 fL (ref 78.0–100.0)
Platelets: 289 10*3/uL (ref 150–400)
RBC: 5 MIL/uL (ref 3.87–5.11)
RDW: 12 % (ref 11.5–15.5)
WBC: 14.6 10*3/uL — AB (ref 4.0–10.5)

## 2017-11-16 LAB — COMPREHENSIVE METABOLIC PANEL
ALK PHOS: 50 U/L (ref 38–126)
ALT: 21 U/L (ref 14–54)
AST: 26 U/L (ref 15–41)
Albumin: 4.4 g/dL (ref 3.5–5.0)
Anion gap: 15 (ref 5–15)
BUN: 12 mg/dL (ref 6–20)
CO2: 19 mmol/L — ABNORMAL LOW (ref 22–32)
CREATININE: 0.7 mg/dL (ref 0.44–1.00)
Calcium: 9.2 mg/dL (ref 8.9–10.3)
Chloride: 102 mmol/L (ref 101–111)
Glucose, Bld: 235 mg/dL — ABNORMAL HIGH (ref 65–99)
Potassium: 3.6 mmol/L (ref 3.5–5.1)
Sodium: 136 mmol/L (ref 135–145)
Total Bilirubin: 0.5 mg/dL (ref 0.3–1.2)
Total Protein: 7.4 g/dL (ref 6.5–8.1)

## 2017-11-16 LAB — PROTIME-INR
INR: 1.01
Prothrombin Time: 13.2 seconds (ref 11.4–15.2)

## 2017-11-16 LAB — I-STAT BETA HCG BLOOD, ED (MC, WL, AP ONLY)

## 2017-11-16 LAB — I-STAT TROPONIN, ED: TROPONIN I, POC: 0 ng/mL (ref 0.00–0.08)

## 2017-11-16 MED ORDER — ONDANSETRON HCL 4 MG/2ML IJ SOLN
4.0000 mg | Freq: Once | INTRAMUSCULAR | Status: AC
Start: 2017-11-16 — End: 2017-11-16
  Administered 2017-11-16: 4 mg via INTRAVENOUS
  Filled 2017-11-16: qty 2

## 2017-11-16 NOTE — ED Triage Notes (Addendum)
Pt noted to have a tremor in her left arm as well. No other neuro deficits. Pt does have confusion as well. Unable to state year.

## 2017-11-16 NOTE — ED Provider Notes (Signed)
Patient placed in Quick Look pathway, seen and evaluated   Chief Complaint: migraine, L arm numbness  HPI:   Frontal headache since yesterday, along with tingling L arm sensation since.  Appears confused.   ROS: chest pain or arm pain.  No neck stiffness, no fever  Physical Exam:   Gen: No distress  Neuro: Awake and Alert  Skin: Warm    Focused Exam: A&Ox3, no nuchal rigidity, occasional tremor L shoulder, sensation intact throughout, mild antalgic gait.    Initiation of care has begun. The patient has been counseled on the process, plan, and necessity for staying for the completion/evaluation, and the remainder of the medical screening examination     Fayrene Helperran, Breylon Sherrow, Cordelia Poche-C 11/16/17 1512    Margarita Grizzleay, Danielle, MD 11/19/17 1224

## 2017-11-16 NOTE — ED Triage Notes (Signed)
Per EMS- pt was at her PCP for migraine. Staff believed that it may be anxiety related. Pt reports that this pain started yesterday. Pt also reported left arm numbness that started yesterday. Pt is Sinus tach at 110. CBG 232. BP 144/94 Pt reported by EMS to be "acting alittle off".

## 2017-11-17 ENCOUNTER — Emergency Department (HOSPITAL_COMMUNITY)
Admission: EM | Admit: 2017-11-17 | Discharge: 2017-11-17 | Disposition: A | Discharge status: Home / Self Care | Payer: BLUE CROSS/BLUE SHIELD | Attending: Emergency Medicine | Admitting: Emergency Medicine

## 2017-11-17 DIAGNOSIS — R51 Headache: Secondary | ICD-10-CM

## 2017-11-17 DIAGNOSIS — R519 Headache, unspecified: Secondary | ICD-10-CM

## 2017-11-17 NOTE — ED Notes (Signed)
Pt remains in waiting room. Updated on wait for treatment room. 

## 2017-11-17 NOTE — Discharge Instructions (Signed)
As we discussed, labs and head CT today looked good. Follow-up with your primary care doctor and/or your neurologist. Return here for any new/acute changes.

## 2017-11-17 NOTE — ED Provider Notes (Signed)
[facility]   CSN: [id number] Arrival date & time: 11/16/17  1450     History   Chief Complaint Chief Complaint  Patient presents with  . Migraine    HPI Ashlee Dennis is a 47 y.o. female.  The history is provided by the patient and medical records.    47 y.o. F with hx of allergies, DM, GERD, anxiety, thyroid disease, presenting to the ED for migraine headache.  States she had a migraine that started yesterday morning.  Headache generalized, accompanied by photophobia which is typical for her. She went to her primary care doctor for this and apparently had a panic attack while there and began hyperventilating and complaining that "her whole body went numb" so she was sent to the ED for evaluation.  States upon arrival she felt a lot of "tingling" in her arms and legs bilaterally.  She did not notice any focal weakness, trouble walking, slurred speech, difficulty concentrating, etc.  States she was in a great deal of pain upon arrival here and did not want to answer a lot of questions.  Not on anticoagulation.  No head trauma/falls. Unfortunately, patient has been in the waiting room for about 12 hours and by time of my evaluation states she feels completely back to baseline.  States now she just feels tired and is ready to go home.  She has no history of TIA or stroke.  Does follow with neurology for migraines.  Past Medical History:  Diagnosis Date  . Allergy   . Diabetes mellitus without complication (HCC)   . GERD (gastroesophageal reflux disease)   . Thyroid disease     Patient Active Problem List   Diagnosis Date Noted  . Anxiety 07/01/2017  . Hammertoe of right foot 07/01/2017  . Belching 11/29/2016  . Hiccups 11/29/2016  . Nausea and vomiting in adult 11/29/2016  . Gastroesophageal reflux disease 05/05/2016  . Migraine 12/22/2015  . Paresthesia 12/22/2015  . Metatarsalgia 03/25/2015  . Type 2 diabetes mellitus (HCC)  10/05/2012  . Hypothyroidism 10/05/2012    Past Surgical History:  Procedure Laterality Date  . ABDOMINAL HYSTERECTOMY    . BREAST SURGERY     Cancer  . KNEE ARTHROSCOPY    . Teeth Implants     x 2    OB History    No data available       Home Medications    Prior to Admission medications   Medication Sig Start Date End Date Taking? Authorizing Provider  fexofenadine (ALLEGRA) 180 MG tablet Take 180 mg by mouth daily.    [provider]  fluticasone (FLONASE) 50 MCG/ACT nasal spray as needed. 11/25/16   [provider]  glimepiride (AMARYL) 2 MG tablet daily. 11/17/16   [provider]  levothyroxine (SYNTHROID, LEVOTHROID) 75 MCG tablet Take 75 mcg by mouth daily.    [provider]  montelukast (SINGULAIR) 10 MG tablet TAKE 1 TABLET (10 MG) BY MOUTH AT BEDTIME 07/01/15   Jeffery, Chelle, PA-C  pantoprazole (PROTONIX) 40 MG tablet Take 1 tablet by mouth daily. 12/09/16   [provider]  triamcinolone cream (KENALOG) 0.1 % Apply 1 application topically 2 (two) times daily. 02/25/17   Peyton NajjarHopper, David H, MD    Family History Family History  Problem Relation Age of Onset  . Cancer Brother 30       renal cell carcinoma  . Healthy Mother   . Skin cancer Father   . Thyroid  disease Sister   . Thyroid disease Sister   . Colon cancer Neg Hx   . Stomach cancer Neg Hx   . Rectal cancer Neg Hx   . Esophageal cancer Neg Hx   . Liver cancer Neg Hx     Social History Social History   Tobacco Use  . Smoking status: Never Smoker  . Smokeless tobacco: Never Used  Substance Use Topics  . Alcohol use: No    Alcohol/week: 0.0 oz  . Drug use: No     Allergies   Asa [aspirin]; Aspartame and phenylalanine; Codeine; Dextroamphetamine; Erythromycin; Metformin and related; Mint chocolate chip flavor; Oxycodone; Penicillins; and Pennsaid [diclofenac sodium]   Review of Systems Review of Systems  Neurological: Positive for headaches.  All  other systems reviewed and are negative.    Physical Exam Updated Vital Signs BP 130/78 (BP Location: Right Arm)   Pulse 80   Temp 98 F (36.7 C) (Oral)   Resp 17   SpO2 100%   Physical Exam  Constitutional: She is oriented to person, place, and time. She appears well-developed and well-nourished. No distress.  HENT:  Head: Normocephalic and atraumatic.  Right Ear: External ear normal.  Left Ear: External ear normal.  Mouth/Throat: Oropharynx is clear and moist.  Eyes: Conjunctivae and EOM are normal. Pupils are equal, round, and reactive to light.  Neck: Normal range of motion and full passive range of motion without pain. Neck supple. No neck rigidity.  No rigidity, no meningismus  Cardiovascular: Normal rate, regular rhythm and normal heart sounds.  No murmur heard. Pulmonary/Chest: Effort normal and breath sounds normal. No respiratory distress. She has no wheezes. She has no rhonchi.  Abdominal: Soft. Bowel sounds are normal. There is no tenderness. There is no guarding.  Musculoskeletal: Normal range of motion. She exhibits no edema.  Neurological: She is alert and oriented to person, place, and time. She has normal strength. She displays no tremor. No cranial nerve deficit or sensory deficit. She displays no seizure activity.  AAOx3, answering questions and following commands appropriately; equal strength UE and LE bilaterally; CN grossly intact; moves all extremities appropriately without ataxia; no focal neuro deficits or facial asymmetry appreciated  Skin: Skin is warm and dry. No rash noted. She is not diaphoretic.  Psychiatric: She has a normal mood and affect. Her behavior is normal. Thought content normal.  Nursing note and vitals reviewed.    ED Treatments / Results  Labs (all labs ordered are listed, but only abnormal results are displayed) Labs Reviewed  CBC - Abnormal; Notable for the following components:      Result Value   WBC 14.6 (*)    All other  components within normal limits  DIFFERENTIAL - Abnormal; Notable for the following components:   Neutro Abs 12.5 (*)    All other components within normal limits  COMPREHENSIVE METABOLIC PANEL - Abnormal; Notable for the following components:   CO2 19 (*)    Glucose, Bld 235 (*)    All other components within normal limits  I-STAT CHEM 8, ED - Abnormal; Notable for the following components:   Potassium 3.4 (*)    Chloride 100 (*)    Glucose, Bld 235 (*)    Calcium, Ion 1.09 (*)    All other components within normal limits  PROTIME-INR  APTT  I-STAT TROPONIN, ED  I-STAT BETA HCG BLOOD, ED ([facility], [facility], [facility] ONLY)  CBG MONITORING, ED    EKG  EKG Interpretation None  Radiology Ct Head Wo Contrast  Result Date: 11/16/2017 CLINICAL DATA:  Migraine headache with LEFT upper extremity numbness since yesterday, history of migraines, focal neural deficit of greater than 6 hours, type II diabetes mellitus EXAM: CT HEAD WITHOUT CONTRAST TECHNIQUE: Contiguous axial images were obtained from the base of the skull through the vertex without intravenous contrast. Sagittal and coronal MPR images reconstructed from axial data set. COMPARISON:  None FINDINGS: Brain: Normal ventricular morphology. No midline shift or mass effect. Normal appearance of brain parenchyma. No intracranial hemorrhage, mass lesion, evidence of acute infarction, or extra-axial fluid collection. Vascular: Normal appearance Skull: Normal appearance Sinuses/Orbits: Clear Other: N/A IMPRESSION: Normal exam. Electronically Signed   By: Ulyses Southward M.D.   On: 11/16/2017 16:18    Procedures Procedures (including critical care time)  Medications Ordered in ED Medications  ondansetron (ZOFRAN) injection 4 mg (4 mg Intravenous Given 11/16/17 1516)     Initial Impression / Assessment and Plan / ED Course  I have reviewed the triage vital signs and the nursing notes.  Pertinent labs & imaging results that were available during my  care of the patient were reviewed by me and considered in my medical decision making (see chart for details).  47 year old female presenting to the ED with migraine headache.  Began yesterday.  Went to PCP office and apparently had a panic attack and was sent to the ED for evaluation.  States headache was generalized accompanied by full body "tingling".  There is no focal numbness, weakness, confusion, slurred speech, blurred vision, etc.  Patient unfortunately in the waiting room for 12+ hours, however did have full workup completed while waiting including labs and CT scan of her head.  On my assessment, patient appears back to her neurologic baseline.  She has no focal neurologic deficits.  Her speech is clear and goal oriented.  Denies any current headache or sensation of tingling/paresthesias.  Her exam is benign at this time.  We discussed her results.  As patient's symptoms were accompanied by headache and paresthesias were occurring bilaterally, low suspicion for TIA or stroke.  No signs/symptoms concerning for meningitis or other infectious process.  Suspect symptoms may be due to anxiety versus complicated migraine.  Patient does follow with neurologist regularly and I feel she is stable to be discharged home with OP follow-up.  Discussed plan with patient, she acknowledged understanding and agreed with plan of care.  Return precautions given for new or worsening symptoms.  Final Clinical Impressions(s) / ED Diagnoses   Final diagnoses:  Bad headache    ED Discharge Orders    None       Garlon Hatchet, PA-C 11/17/17 0304    Tilden Fossa, MD 11/19/17 [phone]

## 2017-11-21 DIAGNOSIS — H903 Sensorineural hearing loss, bilateral: Secondary | ICD-10-CM | POA: Diagnosis not present

## 2017-11-21 DIAGNOSIS — Z6828 Body mass index (BMI) 28.0-28.9, adult: Secondary | ICD-10-CM | POA: Diagnosis not present

## 2017-11-21 DIAGNOSIS — Z1389 Encounter for screening for other disorder: Secondary | ICD-10-CM | POA: Diagnosis not present

## 2017-11-21 DIAGNOSIS — Z01419 Encounter for gynecological examination (general) (routine) without abnormal findings: Secondary | ICD-10-CM | POA: Diagnosis not present

## 2017-11-21 DIAGNOSIS — H6123 Impacted cerumen, bilateral: Secondary | ICD-10-CM | POA: Diagnosis not present

## 2017-11-22 DIAGNOSIS — G43009 Migraine without aura, not intractable, without status migrainosus: Secondary | ICD-10-CM | POA: Diagnosis not present

## 2018-01-25 ENCOUNTER — Encounter: Payer: Self-pay | Admitting: Neurology

## 2018-01-25 ENCOUNTER — Ambulatory Visit: Payer: BLUE CROSS/BLUE SHIELD | Admitting: Neurology

## 2018-01-25 VITALS — BP 134/78 | HR 105 | Ht 63.5 in | Wt 166.0 lb

## 2018-01-25 DIAGNOSIS — G43709 Chronic migraine without aura, not intractable, without status migrainosus: Secondary | ICD-10-CM | POA: Diagnosis not present

## 2018-01-25 DIAGNOSIS — IMO0002 Reserved for concepts with insufficient information to code with codable children: Secondary | ICD-10-CM | POA: Insufficient documentation

## 2018-01-25 MED ORDER — SUMATRIPTAN SUCCINATE 100 MG PO TABS: 100.0000 mg | ORAL_TABLET | Freq: Once | ORAL | 6 refills | Status: AC | PRN

## 2018-01-25 NOTE — Progress Notes (Signed)
PATIENT: Ashlee Dennis DOB: 1970/11/22  Chief Complaint  Patient presents with  . Migraine    Last seen 12/22/15 for migraines.  She has not had another migraine since that visit until February 2019.  Reports three days of severe migraine that caused body numbness and vision disturbance.  She was transported and treated in ED at [facility].  They were able to resolve the migraine with IV medications. Normal CT head scan.  No further episodes since February.     HISTORICAL  Ashlee Dennis is a 47 year old female, seen in refer by emergency room for evaluation of migraine headaches, with associated paresthesia  I saw her initially in March 2017 for migraine headaches, intermittent bilateral hands paresthesia, was referred by her primary care doctor Renford Dills  She has diabetes that was diet controlled, hypothyroidism with small goiter, on thyroid supplement, mild sleep apnea, vocal cord paralysis, history of kidney stone, GERD, history of right breast cancer, partial right mastectomy, does not require chemoradiation therapy.  She reported a history of baseball injury in 2012, a baseball hit her face, no loss of consciousness  She had a history of migraine since 1992, only happened occasionally, she had to typical migraine within a week at the end of February, right parietal area severe pounding headache with associated light noise sensitivity, nauseous, movements make it worse, lasting about 1 hour, relieved by taking a nap, Advil as needed. She also reported visual distortion before the onset of moderate to severe right lateralized typical migraine headache  But in between, she complains of bilateral "sinus" problem, on further questioning, she reported bilateral retro-orbital area mild to moderate pressure sensation usually induced by weather change, exertion, bright sunlight, hungry, lack of sleep, which is consistent with mild migraine  Since January 2017 she complains of intermittent  bilateral hands paresthesia, usually triggered by putting pressure on her hands during sleep,   She rarely has any headaches since her visit in 2017, had 2 consecutive days of severe headache on February 5, and 6, there was also associated whole-body paresthesia, she presented to the emergency room, had a CT head without contrast in February 2019 that was normal, the paresthesia improved after headaches,  MRI of lumbar in January 2018 showed mild lumbar degenerative disease,   REVIEW OF SYSTEMS: Full 14 system review of systems performed and notable only for headaches hearing loss, apnea  ALLERGIES: Allergies  Allergen Reactions  . Asa [Aspirin] Other (See Comments)    GERD  . Aspartame And Phenylalanine     Joint aches  . Codeine Hives  . Dextroamphetamine Hives  . Erythromycin Hives  . Metformin And Related Hives  . Mint Chocolate Chip Flavor   . Other     Splenda, Stevia, Truvia  . Oxycodone Hives  . Penicillins Hives  . Pennsaid [Diclofenac Sodium] Hives    HOME MEDICATIONS: Current Outpatient Medications  Medication Sig Dispense Refill  . fexofenadine (ALLEGRA) 180 MG tablet Take 180 mg by mouth daily.    . fluticasone (FLONASE) 50 MCG/ACT nasal spray as needed.  3  . glimepiride (AMARYL) 2 MG tablet daily.  12  . levothyroxine (SYNTHROID, LEVOTHROID) 75 MCG tablet Take 75 mcg by mouth daily.    . pantoprazole (PROTONIX) 40 MG tablet Take 1 tablet by mouth daily.  1   No current facility-administered medications for this visit.     PAST MEDICAL HISTORY: Past Medical History:  Diagnosis Date  . Allergy   . Diabetes mellitus without complication (HCC)   .  GERD (gastroesophageal reflux disease)   . Migraine   . Thyroid disease     PAST SURGICAL HISTORY: Past Surgical History:  Procedure Laterality Date  . ABDOMINAL HYSTERECTOMY    . BREAST SURGERY     Cancer  . KNEE ARTHROSCOPY    . Teeth Implants     x 2    FAMILY HISTORY: Family History  Problem  Relation Age of Onset  . Cancer Brother 30       renal cell carcinoma  . Migraines Mother   . Seizures Mother   . Skin cancer Father   . Thyroid disease Sister   . Thyroid disease Sister   . Colon cancer Neg Hx   . Stomach cancer Neg Hx   . Rectal cancer Neg Hx   . Esophageal cancer Neg Hx   . Liver cancer Neg Hx     SOCIAL HISTORY:  Social History   Socioeconomic History  . Marital status: Significant Other    Spouse name: Not on file  . Number of children: 0  . Years of education: college  . Highest education level: Not on file  Occupational History  . Occupation: substitute Magazine features editorteacher    Employer: [employer]  Social Needs  . Financial resource strain: Not on file  . Food insecurity:    Worry: Not on file    Inability: Not on file  . Transportation needs:    Medical: Not on file    Non-medical: Not on file  Tobacco Use  . Smoking status: Never Smoker  . Smokeless tobacco: Never Used  Substance and Sexual Activity  . Alcohol use: No    Alcohol/week: 0.0 oz  . Drug use: No  . Sexual activity: Yes    Partners: Male    Birth control/protection: Condom  Lifestyle  . Physical activity:    Days per week: Not on file    Minutes per session: Not on file  . Stress: Not on file  Relationships  . Social connections:    Talks on phone: Not on file    Gets together: Not on file    Attends religious service: Not on file    Active member of club or organization: Not on file    Attends meetings of clubs or organizations: Not on file    Relationship status: Not on file  . Intimate partner violence:    Fear of current or ex partner: Not on file    Emotionally abused: Not on file    Physically abused: Not on file    Forced sexual activity: Not on file  Other Topics Concern  . Not on file  Social History Narrative   Lives with her boyfriend.   Right-handed.   2-3 cups caffeine per day.     PHYSICAL EXAM   Vitals:   01/25/18 0854  BP: 134/78  Pulse: (!) 105  Weight:  166 lb (75.3 kg)  Height: 5' 3.5" (1.613 m)    Not recorded      Body mass index is 28.94 kg/m.  PHYSICAL EXAMNIATION:  Gen: NAD, conversant, well nourised, obese, well groomed                     Cardiovascular: Regular rate rhythm, no peripheral edema, warm, nontender. Eyes: Conjunctivae clear without exudates or hemorrhage Neck: Supple, no carotid bruits. Pulmonary: Clear to auscultation bilaterally   NEUROLOGICAL EXAM:  MENTAL STATUS: Speech:    Speech is normal; fluent and spontaneous with normal comprehension.  Cognition:     Orientation to time, place and person     Normal recent and remote memory     Normal Attention span and concentration     Normal Language, naming, repeating,spontaneous speech     Fund of knowledge   CRANIAL NERVES: CN II: Visual fields are full to confrontation. Fundoscopic exam is normal with sharp discs and no vascular changes. Pupils are round equal and briskly reactive to light. CN III, IV, VI: extraocular movement are normal. No ptosis. CN V: Facial sensation is intact to pinprick in all 3 divisions bilaterally. Corneal responses are intact.  CN VII: Face is symmetric with normal eye closure and smile. CN VIII: Hearing is normal to rubbing fingers CN IX, X: Palate elevates symmetrically. Phonation is normal. CN XI: Head turning and shoulder shrug are intact CN XII: Tongue is midline with normal movements and no atrophy.  MOTOR: There is no pronator drift of out-stretched arms. Muscle bulk and tone are normal. Muscle strength is normal.  REFLEXES: Reflexes are 2+ and symmetric at the biceps, triceps, knees, and ankles. Plantar responses are flexor.  SENSORY: Intact to light touch, pinprick, positional sensation and vibratory sensation are intact in fingers and toes.  COORDINATION: Rapid alternating movements and fine finger movements are intact. There is no dysmetria on finger-to-nose and heel-knee-shin.    GAIT/STANCE: Posture is  normal. Gait is steady with normal steps, base, arm swing, and turning. Heel and toe walking are normal. Tandem gait is normal.  Romberg is absent.   DIAGNOSTIC DATA (LABS, IMAGING, TESTING) - I reviewed patient records, labs, notes, testing and imaging myself where available.   ASSESSMENT AND PLAN  Shyna Grether is a 47 y.o. female   Chronic migraine headaches  Imitrex milligrams as needed  Paresthesia during intense headache, can be part of the migraine, there was no sensory complaints in between headaches,   Levert Feinstein, M.D. Ph.D.  [facility] [address], [address] [place], [place] [address] Ph: [phone] Fax: [phone]  CC: Referring Provider

## 2018-02-22 DIAGNOSIS — M722 Plantar fascial fibromatosis: Secondary | ICD-10-CM | POA: Diagnosis not present

## 2018-02-22 DIAGNOSIS — M79672 Pain in left foot: Secondary | ICD-10-CM | POA: Diagnosis not present

## 2018-02-22 DIAGNOSIS — M2042 Other hammer toe(s) (acquired), left foot: Secondary | ICD-10-CM | POA: Diagnosis not present

## 2018-02-22 DIAGNOSIS — M79671 Pain in right foot: Secondary | ICD-10-CM | POA: Diagnosis not present

## 2018-02-22 DIAGNOSIS — M2041 Other hammer toe(s) (acquired), right foot: Secondary | ICD-10-CM | POA: Diagnosis not present

## 2018-02-22 DIAGNOSIS — M7752 Other enthesopathy of left foot: Secondary | ICD-10-CM | POA: Diagnosis not present

## 2018-02-22 DIAGNOSIS — M7751 Other enthesopathy of right foot: Secondary | ICD-10-CM | POA: Diagnosis not present

## 2018-03-31 DIAGNOSIS — E039 Hypothyroidism, unspecified: Secondary | ICD-10-CM | POA: Diagnosis not present

## 2018-03-31 DIAGNOSIS — R252 Cramp and spasm: Secondary | ICD-10-CM | POA: Diagnosis not present

## 2018-03-31 DIAGNOSIS — E119 Type 2 diabetes mellitus without complications: Secondary | ICD-10-CM | POA: Diagnosis not present

## 2018-04-05 DIAGNOSIS — H6123 Impacted cerumen, bilateral: Secondary | ICD-10-CM | POA: Diagnosis not present

## 2018-04-05 DIAGNOSIS — H903 Sensorineural hearing loss, bilateral: Secondary | ICD-10-CM | POA: Diagnosis not present

## 2018-04-06 DIAGNOSIS — M792 Neuralgia and neuritis, unspecified: Secondary | ICD-10-CM | POA: Diagnosis not present

## 2018-04-17 DIAGNOSIS — M9901 Segmental and somatic dysfunction of cervical region: Secondary | ICD-10-CM | POA: Diagnosis not present

## 2018-04-17 DIAGNOSIS — M9902 Segmental and somatic dysfunction of thoracic region: Secondary | ICD-10-CM | POA: Diagnosis not present

## 2018-04-17 DIAGNOSIS — M9903 Segmental and somatic dysfunction of lumbar region: Secondary | ICD-10-CM | POA: Diagnosis not present

## 2018-04-19 DIAGNOSIS — M9902 Segmental and somatic dysfunction of thoracic region: Secondary | ICD-10-CM | POA: Diagnosis not present

## 2018-04-19 DIAGNOSIS — M9901 Segmental and somatic dysfunction of cervical region: Secondary | ICD-10-CM | POA: Diagnosis not present

## 2018-04-19 DIAGNOSIS — M9903 Segmental and somatic dysfunction of lumbar region: Secondary | ICD-10-CM | POA: Diagnosis not present

## 2018-04-19 DIAGNOSIS — M47816 Spondylosis without myelopathy or radiculopathy, lumbar region: Secondary | ICD-10-CM | POA: Diagnosis not present

## 2018-04-24 DIAGNOSIS — M9901 Segmental and somatic dysfunction of cervical region: Secondary | ICD-10-CM | POA: Diagnosis not present

## 2018-04-24 DIAGNOSIS — M9902 Segmental and somatic dysfunction of thoracic region: Secondary | ICD-10-CM | POA: Diagnosis not present

## 2018-04-24 DIAGNOSIS — M9903 Segmental and somatic dysfunction of lumbar region: Secondary | ICD-10-CM | POA: Diagnosis not present

## 2018-04-26 DIAGNOSIS — M9902 Segmental and somatic dysfunction of thoracic region: Secondary | ICD-10-CM | POA: Diagnosis not present

## 2018-04-26 DIAGNOSIS — M47816 Spondylosis without myelopathy or radiculopathy, lumbar region: Secondary | ICD-10-CM | POA: Diagnosis not present

## 2018-04-26 DIAGNOSIS — M9901 Segmental and somatic dysfunction of cervical region: Secondary | ICD-10-CM | POA: Diagnosis not present

## 2018-05-01 DIAGNOSIS — M9903 Segmental and somatic dysfunction of lumbar region: Secondary | ICD-10-CM | POA: Diagnosis not present

## 2018-05-01 DIAGNOSIS — M9901 Segmental and somatic dysfunction of cervical region: Secondary | ICD-10-CM | POA: Diagnosis not present

## 2018-05-01 DIAGNOSIS — M47816 Spondylosis without myelopathy or radiculopathy, lumbar region: Secondary | ICD-10-CM | POA: Diagnosis not present

## 2018-05-02 DIAGNOSIS — M9901 Segmental and somatic dysfunction of cervical region: Secondary | ICD-10-CM | POA: Diagnosis not present

## 2018-05-02 DIAGNOSIS — M9902 Segmental and somatic dysfunction of thoracic region: Secondary | ICD-10-CM | POA: Diagnosis not present

## 2018-05-02 DIAGNOSIS — M47816 Spondylosis without myelopathy or radiculopathy, lumbar region: Secondary | ICD-10-CM | POA: Diagnosis not present

## 2018-05-02 DIAGNOSIS — M9903 Segmental and somatic dysfunction of lumbar region: Secondary | ICD-10-CM | POA: Diagnosis not present

## 2018-05-10 DIAGNOSIS — M9902 Segmental and somatic dysfunction of thoracic region: Secondary | ICD-10-CM | POA: Diagnosis not present

## 2018-05-10 DIAGNOSIS — M9903 Segmental and somatic dysfunction of lumbar region: Secondary | ICD-10-CM | POA: Diagnosis not present

## 2018-05-10 DIAGNOSIS — M47816 Spondylosis without myelopathy or radiculopathy, lumbar region: Secondary | ICD-10-CM | POA: Diagnosis not present

## 2018-05-10 DIAGNOSIS — M9901 Segmental and somatic dysfunction of cervical region: Secondary | ICD-10-CM | POA: Diagnosis not present

## 2018-07-25 DIAGNOSIS — R05 Cough: Secondary | ICD-10-CM | POA: Diagnosis not present

## 2018-07-25 DIAGNOSIS — R0989 Other specified symptoms and signs involving the circulatory and respiratory systems: Secondary | ICD-10-CM | POA: Diagnosis not present

## 2018-07-25 DIAGNOSIS — J0100 Acute maxillary sinusitis, unspecified: Secondary | ICD-10-CM | POA: Diagnosis not present

## 2018-08-29 DIAGNOSIS — J310 Chronic rhinitis: Secondary | ICD-10-CM | POA: Diagnosis not present

## 2018-09-22 DIAGNOSIS — Z1231 Encounter for screening mammogram for malignant neoplasm of breast: Secondary | ICD-10-CM | POA: Diagnosis not present

## 2018-09-22 DIAGNOSIS — Z803 Family history of malignant neoplasm of breast: Secondary | ICD-10-CM | POA: Diagnosis not present

## 2018-10-05 DIAGNOSIS — H6123 Impacted cerumen, bilateral: Secondary | ICD-10-CM | POA: Diagnosis not present

## 2018-10-05 DIAGNOSIS — H903 Sensorineural hearing loss, bilateral: Secondary | ICD-10-CM | POA: Diagnosis not present

## 2018-11-22 DIAGNOSIS — G43009 Migraine without aura, not intractable, without status migrainosus: Secondary | ICD-10-CM | POA: Diagnosis not present

## 2018-11-22 DIAGNOSIS — E119 Type 2 diabetes mellitus without complications: Secondary | ICD-10-CM | POA: Diagnosis not present

## 2018-11-22 DIAGNOSIS — Z6821 Body mass index (BMI) 21.0-21.9, adult: Secondary | ICD-10-CM | POA: Diagnosis not present

## 2018-11-22 DIAGNOSIS — Z01419 Encounter for gynecological examination (general) (routine) without abnormal findings: Secondary | ICD-10-CM | POA: Diagnosis not present

## 2018-11-22 DIAGNOSIS — Z0000 Encounter for general adult medical examination without abnormal findings: Secondary | ICD-10-CM | POA: Diagnosis not present

## 2018-11-22 DIAGNOSIS — E039 Hypothyroidism, unspecified: Secondary | ICD-10-CM | POA: Diagnosis not present

## 2018-12-19 DIAGNOSIS — D2361 Other benign neoplasm of skin of right upper limb, including shoulder: Secondary | ICD-10-CM | POA: Diagnosis not present

## 2018-12-19 DIAGNOSIS — L821 Other seborrheic keratosis: Secondary | ICD-10-CM | POA: Diagnosis not present

## 2018-12-19 DIAGNOSIS — L853 Xerosis cutis: Secondary | ICD-10-CM | POA: Diagnosis not present

## 2018-12-19 DIAGNOSIS — L918 Other hypertrophic disorders of the skin: Secondary | ICD-10-CM | POA: Diagnosis not present

## 2019-01-18 ENCOUNTER — Telehealth: Payer: Self-pay | Admitting: Neurology

## 2019-01-18 NOTE — Telephone Encounter (Signed)
I added appointment to Dr. Dennard Schaumann calendar and sent patient invitation email.

## 2019-01-18 NOTE — Telephone Encounter (Signed)
Pt has a appt on 01/29/19 pt has agreed to web ex visit and gave consent  Email- [email or web address]

## 2019-01-29 ENCOUNTER — Ambulatory Visit (INDEPENDENT_AMBULATORY_CARE_PROVIDER_SITE_OTHER): Payer: BLUE CROSS/BLUE SHIELD | Admitting: Neurology

## 2019-01-29 ENCOUNTER — Other Ambulatory Visit: Payer: Self-pay

## 2019-01-29 ENCOUNTER — Encounter: Payer: Self-pay | Admitting: Neurology

## 2019-01-29 DIAGNOSIS — G43709 Chronic migraine without aura, not intractable, without status migrainosus: Secondary | ICD-10-CM

## 2019-01-29 DIAGNOSIS — IMO0002 Reserved for concepts with insufficient information to code with codable children: Secondary | ICD-10-CM

## 2019-01-29 NOTE — Progress Notes (Signed)
    PATIENT: Ashlee Dennis DOB: June 14, 1971   HISTORICAL  Ashlee Dennis is a 48 year old female, seen in refer by emergency room for evaluation of migraine headaches, with associated paresthesia  I saw her initially in March 2017 for migraine headaches, intermittent bilateral hands paresthesia, was referred by her primary care doctor Renford Dills  She has diabetes that was diet controlled, hypothyroidism with small goiter, on thyroid supplement, mild sleep apnea, vocal cord paralysis, history of kidney stone, GERD, history of right breast cancer, partial right mastectomy, does not require chemoradiation therapy.  She reported a history of baseball injury in 2012, a baseball hit her face, no loss of consciousness  She had a history of migraine since 1992, only happened occasionally, she had to typical migraine within a week at the end of February, right parietal area severe pounding headache with associated light noise sensitivity, nauseous, movements make it worse, lasting about 1 hour, relieved by taking a nap, Advil as needed. She also reported visual distortion before the onset of moderate to severe right lateralized typical migraine headache  But in between, she complains of bilateral "sinus" problem, on further questioning, she reported bilateral retro-orbital area mild to moderate pressure sensation usually induced by weather change, exertion, bright sunlight, hungry, lack of sleep, which is consistent with mild migraine  Since January 2017 she complains of intermittent bilateral hands paresthesia, usually triggered by putting pressure on her hands during sleep,   She rarely has any headaches since her visit in 2017, had 2 consecutive days of severe headache on February 5, and 6, there was also associated whole-body paresthesia, she presented to the emergency room, had a CT head without contrast in February 2019 that was normal, the paresthesia improved after headaches,  MRI of lumbar in  January 2018 showed mild lumbar degenerative disease,  Virtual Visit via Video  I connected with Burnett Corrente on 01/29/19 at  by Video and verified that I am speaking with the correct person using two identifiers.   I discussed the limitations, risks, security and privacy concerns of performing an evaluation and management service by Video and the availability of in person appointments. I also discussed with the patient that there may be a patient responsible charge related to this service. The patient expressed understanding and agreed to proceed.   History of Present Illness: Her migraine has much improved, overall doing well   Observations/Objective: I have reviewed problem lists, medications, allergies. Awake alert and oriented to history taking and casual conversation  Assessment and Plan: Chronic migraine headaches  Imitrex as needed  Follow Up Instructions:  As needed    I discussed the assessment and treatment plan with the patient. The patient was provided an opportunity to ask questions and all were answered. The patient agreed with the plan and demonstrated an understanding of the instructions.   The patient was advised to call back or seek an in-person evaluation if the symptoms worsen or if the condition fails to improve as anticipated.  I provided 10 minutes of non-face-to-face time during this encounter.   Levert Feinstein, MD

## 2019-02-08 DIAGNOSIS — E162 Hypoglycemia, unspecified: Secondary | ICD-10-CM | POA: Diagnosis not present

## 2019-02-08 DIAGNOSIS — E119 Type 2 diabetes mellitus without complications: Secondary | ICD-10-CM | POA: Diagnosis not present

## 2019-02-09 DIAGNOSIS — E119 Type 2 diabetes mellitus without complications: Secondary | ICD-10-CM | POA: Diagnosis not present

## 2019-02-12 DIAGNOSIS — H0102A Squamous blepharitis right eye, upper and lower eyelids: Secondary | ICD-10-CM | POA: Diagnosis not present

## 2019-02-12 DIAGNOSIS — H0102B Squamous blepharitis left eye, upper and lower eyelids: Secondary | ICD-10-CM | POA: Diagnosis not present

## 2019-02-19 DIAGNOSIS — H0102B Squamous blepharitis left eye, upper and lower eyelids: Secondary | ICD-10-CM | POA: Diagnosis not present

## 2019-02-19 DIAGNOSIS — H00021 Hordeolum internum right upper eyelid: Secondary | ICD-10-CM | POA: Diagnosis not present

## 2019-02-19 DIAGNOSIS — H0102A Squamous blepharitis right eye, upper and lower eyelids: Secondary | ICD-10-CM | POA: Diagnosis not present

## 2019-02-21 ENCOUNTER — Telehealth: Payer: Self-pay | Admitting: Neurology

## 2019-02-21 NOTE — Telephone Encounter (Signed)
Patient will follow as needed.  °

## 2019-02-21 NOTE — Telephone Encounter (Signed)
-----   Message from Levert Feinstein, MD sent at 01/29/2019  8:40 AM EDT ----- prn

## 2019-03-06 DIAGNOSIS — H0102B Squamous blepharitis left eye, upper and lower eyelids: Secondary | ICD-10-CM | POA: Diagnosis not present

## 2019-03-06 DIAGNOSIS — H00021 Hordeolum internum right upper eyelid: Secondary | ICD-10-CM | POA: Diagnosis not present

## 2019-03-06 DIAGNOSIS — H0102A Squamous blepharitis right eye, upper and lower eyelids: Secondary | ICD-10-CM | POA: Diagnosis not present

## 2019-03-16 DIAGNOSIS — Z03818 Encounter for observation for suspected exposure to other biological agents ruled out: Secondary | ICD-10-CM | POA: Diagnosis not present

## 2019-04-04 DIAGNOSIS — S93492A Sprain of other ligament of left ankle, initial encounter: Secondary | ICD-10-CM | POA: Diagnosis not present

## 2019-05-21 DIAGNOSIS — E119 Type 2 diabetes mellitus without complications: Secondary | ICD-10-CM | POA: Diagnosis not present

## 2019-05-21 DIAGNOSIS — E039 Hypothyroidism, unspecified: Secondary | ICD-10-CM | POA: Diagnosis not present

## 2019-05-23 DIAGNOSIS — E119 Type 2 diabetes mellitus without complications: Secondary | ICD-10-CM | POA: Diagnosis not present

## 2019-05-29 DIAGNOSIS — E139 Other specified diabetes mellitus without complications: Secondary | ICD-10-CM | POA: Diagnosis not present

## 2019-05-29 DIAGNOSIS — M79671 Pain in right foot: Secondary | ICD-10-CM | POA: Diagnosis not present

## 2019-05-29 DIAGNOSIS — L03031 Cellulitis of right toe: Secondary | ICD-10-CM | POA: Diagnosis not present

## 2019-05-29 DIAGNOSIS — M2041 Other hammer toe(s) (acquired), right foot: Secondary | ICD-10-CM | POA: Diagnosis not present

## 2019-06-07 DIAGNOSIS — M21962 Unspecified acquired deformity of left lower leg: Secondary | ICD-10-CM | POA: Diagnosis not present

## 2019-06-07 DIAGNOSIS — E139 Other specified diabetes mellitus without complications: Secondary | ICD-10-CM | POA: Diagnosis not present

## 2019-07-11 DIAGNOSIS — Z23 Encounter for immunization: Secondary | ICD-10-CM | POA: Diagnosis not present

## 2019-08-18 DIAGNOSIS — Z20828 Contact with and (suspected) exposure to other viral communicable diseases: Secondary | ICD-10-CM | POA: Diagnosis not present

## 2019-08-30 DIAGNOSIS — J310 Chronic rhinitis: Secondary | ICD-10-CM | POA: Diagnosis not present

## 2019-09-24 DIAGNOSIS — Z1231 Encounter for screening mammogram for malignant neoplasm of breast: Secondary | ICD-10-CM | POA: Diagnosis not present

## 2019-09-24 DIAGNOSIS — Z803 Family history of malignant neoplasm of breast: Secondary | ICD-10-CM | POA: Diagnosis not present

## 2019-10-29 DIAGNOSIS — G4733 Obstructive sleep apnea (adult) (pediatric): Secondary | ICD-10-CM | POA: Diagnosis not present

## 2019-11-01 DIAGNOSIS — G4733 Obstructive sleep apnea (adult) (pediatric): Secondary | ICD-10-CM | POA: Diagnosis not present

## 2019-11-01 DIAGNOSIS — G4730 Sleep apnea, unspecified: Secondary | ICD-10-CM | POA: Diagnosis not present

## 2019-11-02 DIAGNOSIS — G4730 Sleep apnea, unspecified: Secondary | ICD-10-CM | POA: Diagnosis not present

## 2019-11-02 DIAGNOSIS — G4733 Obstructive sleep apnea (adult) (pediatric): Secondary | ICD-10-CM | POA: Diagnosis not present

## 2019-11-07 DIAGNOSIS — H903 Sensorineural hearing loss, bilateral: Secondary | ICD-10-CM | POA: Diagnosis not present

## 2019-11-27 DIAGNOSIS — Z974 Presence of external hearing-aid: Secondary | ICD-10-CM | POA: Diagnosis not present

## 2019-11-27 DIAGNOSIS — H6123 Impacted cerumen, bilateral: Secondary | ICD-10-CM | POA: Diagnosis not present

## 2019-11-27 DIAGNOSIS — H9193 Unspecified hearing loss, bilateral: Secondary | ICD-10-CM | POA: Diagnosis not present

## 2019-12-02 DIAGNOSIS — G4730 Sleep apnea, unspecified: Secondary | ICD-10-CM | POA: Diagnosis not present

## 2019-12-02 DIAGNOSIS — G4733 Obstructive sleep apnea (adult) (pediatric): Secondary | ICD-10-CM | POA: Diagnosis not present

## 2019-12-06 DIAGNOSIS — M25572 Pain in left ankle and joints of left foot: Secondary | ICD-10-CM | POA: Diagnosis not present

## 2019-12-06 DIAGNOSIS — M12272 Villonodular synovitis (pigmented), left ankle and foot: Secondary | ICD-10-CM | POA: Diagnosis not present

## 2019-12-06 DIAGNOSIS — M65872 Other synovitis and tenosynovitis, left ankle and foot: Secondary | ICD-10-CM | POA: Diagnosis not present

## 2019-12-07 DIAGNOSIS — Z01419 Encounter for gynecological examination (general) (routine) without abnormal findings: Secondary | ICD-10-CM | POA: Diagnosis not present

## 2019-12-07 DIAGNOSIS — Z1389 Encounter for screening for other disorder: Secondary | ICD-10-CM | POA: Diagnosis not present

## 2019-12-07 DIAGNOSIS — Z6828 Body mass index (BMI) 28.0-28.9, adult: Secondary | ICD-10-CM | POA: Diagnosis not present

## 2019-12-17 DIAGNOSIS — R030 Elevated blood-pressure reading, without diagnosis of hypertension: Secondary | ICD-10-CM | POA: Diagnosis not present

## 2019-12-17 DIAGNOSIS — E1169 Type 2 diabetes mellitus with other specified complication: Secondary | ICD-10-CM | POA: Diagnosis not present

## 2019-12-17 DIAGNOSIS — Z800 Family history of malignant neoplasm of digestive organs: Secondary | ICD-10-CM | POA: Diagnosis not present

## 2019-12-17 DIAGNOSIS — G4730 Sleep apnea, unspecified: Secondary | ICD-10-CM | POA: Diagnosis not present

## 2019-12-17 DIAGNOSIS — E039 Hypothyroidism, unspecified: Secondary | ICD-10-CM | POA: Diagnosis not present

## 2019-12-17 DIAGNOSIS — Z0000 Encounter for general adult medical examination without abnormal findings: Secondary | ICD-10-CM | POA: Diagnosis not present

## 2019-12-20 DIAGNOSIS — M25572 Pain in left ankle and joints of left foot: Secondary | ICD-10-CM | POA: Diagnosis not present

## 2019-12-20 DIAGNOSIS — M12272 Villonodular synovitis (pigmented), left ankle and foot: Secondary | ICD-10-CM | POA: Diagnosis not present

## 2019-12-20 DIAGNOSIS — S93432A Sprain of tibiofibular ligament of left ankle, initial encounter: Secondary | ICD-10-CM | POA: Diagnosis not present

## 2019-12-20 DIAGNOSIS — M65872 Other synovitis and tenosynovitis, left ankle and foot: Secondary | ICD-10-CM | POA: Diagnosis not present

## 2019-12-30 DIAGNOSIS — G4730 Sleep apnea, unspecified: Secondary | ICD-10-CM | POA: Diagnosis not present

## 2019-12-30 DIAGNOSIS — G4733 Obstructive sleep apnea (adult) (pediatric): Secondary | ICD-10-CM | POA: Diagnosis not present

## 2020-01-03 DIAGNOSIS — S93432D Sprain of tibiofibular ligament of left ankle, subsequent encounter: Secondary | ICD-10-CM | POA: Diagnosis not present

## 2020-01-03 DIAGNOSIS — M65872 Other synovitis and tenosynovitis, left ankle and foot: Secondary | ICD-10-CM | POA: Diagnosis not present

## 2020-01-07 DIAGNOSIS — H5213 Myopia, bilateral: Secondary | ICD-10-CM | POA: Diagnosis not present

## 2020-01-07 DIAGNOSIS — D3131 Benign neoplasm of right choroid: Secondary | ICD-10-CM | POA: Diagnosis not present

## 2020-01-07 DIAGNOSIS — H1045 Other chronic allergic conjunctivitis: Secondary | ICD-10-CM | POA: Diagnosis not present

## 2020-01-07 DIAGNOSIS — E119 Type 2 diabetes mellitus without complications: Secondary | ICD-10-CM | POA: Diagnosis not present

## 2020-01-09 DIAGNOSIS — M9902 Segmental and somatic dysfunction of thoracic region: Secondary | ICD-10-CM | POA: Diagnosis not present

## 2020-01-09 DIAGNOSIS — M9907 Segmental and somatic dysfunction of upper extremity: Secondary | ICD-10-CM | POA: Diagnosis not present

## 2020-01-09 DIAGNOSIS — M9901 Segmental and somatic dysfunction of cervical region: Secondary | ICD-10-CM | POA: Diagnosis not present

## 2020-01-09 DIAGNOSIS — H938X3 Other specified disorders of ear, bilateral: Secondary | ICD-10-CM | POA: Diagnosis not present

## 2020-01-09 DIAGNOSIS — M9903 Segmental and somatic dysfunction of lumbar region: Secondary | ICD-10-CM | POA: Diagnosis not present

## 2020-01-14 DIAGNOSIS — Z1211 Encounter for screening for malignant neoplasm of colon: Secondary | ICD-10-CM | POA: Diagnosis not present

## 2020-01-18 DIAGNOSIS — M65872 Other synovitis and tenosynovitis, left ankle and foot: Secondary | ICD-10-CM | POA: Diagnosis not present

## 2020-01-18 DIAGNOSIS — S93432D Sprain of tibiofibular ligament of left ankle, subsequent encounter: Secondary | ICD-10-CM | POA: Diagnosis not present

## 2020-01-30 DIAGNOSIS — G4730 Sleep apnea, unspecified: Secondary | ICD-10-CM | POA: Diagnosis not present

## 2020-01-30 DIAGNOSIS — G4733 Obstructive sleep apnea (adult) (pediatric): Secondary | ICD-10-CM | POA: Diagnosis not present

## 2020-02-12 DIAGNOSIS — D2262 Melanocytic nevi of left upper limb, including shoulder: Secondary | ICD-10-CM | POA: Diagnosis not present

## 2020-02-12 DIAGNOSIS — D2361 Other benign neoplasm of skin of right upper limb, including shoulder: Secondary | ICD-10-CM | POA: Diagnosis not present

## 2020-02-12 DIAGNOSIS — H0262 Xanthelasma of right lower eyelid: Secondary | ICD-10-CM | POA: Diagnosis not present

## 2020-02-12 DIAGNOSIS — L814 Other melanin hyperpigmentation: Secondary | ICD-10-CM | POA: Diagnosis not present

## 2020-03-03 DIAGNOSIS — H938X3 Other specified disorders of ear, bilateral: Secondary | ICD-10-CM | POA: Diagnosis not present

## 2020-05-22 DIAGNOSIS — E8941 Symptomatic postprocedural ovarian failure: Secondary | ICD-10-CM | POA: Diagnosis not present

## 2020-05-23 DIAGNOSIS — E1169 Type 2 diabetes mellitus with other specified complication: Secondary | ICD-10-CM | POA: Diagnosis not present

## 2020-05-23 DIAGNOSIS — E039 Hypothyroidism, unspecified: Secondary | ICD-10-CM | POA: Diagnosis not present

## 2020-05-23 DIAGNOSIS — E7800 Pure hypercholesterolemia, unspecified: Secondary | ICD-10-CM | POA: Diagnosis not present

## 2020-06-20 DIAGNOSIS — N951 Menopausal and female climacteric states: Secondary | ICD-10-CM | POA: Diagnosis not present

## 2020-07-01 DIAGNOSIS — L089 Local infection of the skin and subcutaneous tissue, unspecified: Secondary | ICD-10-CM | POA: Diagnosis not present

## 2020-07-01 DIAGNOSIS — W5501XA Bitten by cat, initial encounter: Secondary | ICD-10-CM | POA: Diagnosis not present

## 2020-08-19 DIAGNOSIS — R351 Nocturia: Secondary | ICD-10-CM | POA: Diagnosis not present

## 2020-08-19 DIAGNOSIS — R350 Frequency of micturition: Secondary | ICD-10-CM | POA: Diagnosis not present

## 2020-08-19 DIAGNOSIS — N3946 Mixed incontinence: Secondary | ICD-10-CM | POA: Diagnosis not present

## 2020-08-20 DIAGNOSIS — R350 Frequency of micturition: Secondary | ICD-10-CM | POA: Diagnosis not present

## 2020-08-20 DIAGNOSIS — Z23 Encounter for immunization: Secondary | ICD-10-CM | POA: Diagnosis not present

## 2020-08-22 DIAGNOSIS — E7800 Pure hypercholesterolemia, unspecified: Secondary | ICD-10-CM | POA: Diagnosis not present

## 2020-08-22 DIAGNOSIS — E1169 Type 2 diabetes mellitus with other specified complication: Secondary | ICD-10-CM | POA: Diagnosis not present

## 2020-09-10 DIAGNOSIS — H6123 Impacted cerumen, bilateral: Secondary | ICD-10-CM | POA: Diagnosis not present

## 2020-09-18 DIAGNOSIS — J301 Allergic rhinitis due to pollen: Secondary | ICD-10-CM | POA: Diagnosis not present

## 2020-09-18 DIAGNOSIS — J3089 Other allergic rhinitis: Secondary | ICD-10-CM | POA: Diagnosis not present

## 2020-09-18 DIAGNOSIS — J310 Chronic rhinitis: Secondary | ICD-10-CM | POA: Diagnosis not present

## 2020-09-29 DIAGNOSIS — Z1231 Encounter for screening mammogram for malignant neoplasm of breast: Secondary | ICD-10-CM | POA: Diagnosis not present

## 2020-10-01 DIAGNOSIS — N3946 Mixed incontinence: Secondary | ICD-10-CM | POA: Diagnosis not present

## 2020-10-01 DIAGNOSIS — R350 Frequency of micturition: Secondary | ICD-10-CM | POA: Diagnosis not present

## 2020-11-04 ENCOUNTER — Other Ambulatory Visit: Payer: Self-pay | Admitting: Neurology

## 2020-11-19 DIAGNOSIS — N3946 Mixed incontinence: Secondary | ICD-10-CM | POA: Diagnosis not present

## 2020-11-19 DIAGNOSIS — R350 Frequency of micturition: Secondary | ICD-10-CM | POA: Diagnosis not present

## 2020-12-19 ENCOUNTER — Other Ambulatory Visit: Payer: Self-pay

## 2020-12-19 ENCOUNTER — Encounter: Payer: Self-pay | Admitting: Podiatry

## 2020-12-19 ENCOUNTER — Ambulatory Visit: Payer: BC Managed Care – PPO | Admitting: Podiatry

## 2020-12-19 DIAGNOSIS — B351 Tinea unguium: Secondary | ICD-10-CM | POA: Diagnosis not present

## 2020-12-21 NOTE — Progress Notes (Signed)
Subjective:   Patient ID: Ashlee Dennis, female   DOB: 50 y.o.   MRN: [id number]   HPI Patient presents stating that she has had trouble with the nails on both feet and has chronic damage to her second toenails of both feet that are thick and have not normality constructed.  They do give her continued problems and she cannot cut them herself and they get painful with shoe gear.  All nails are somewhat thickened but the second nails bilaterally worse   Review of Systems  All other systems reviewed and are negative.       Objective:  Physical Exam Vitals and nursing note reviewed.  Constitutional:      Appearance: She is well-developed.  Pulmonary:     Effort: Pulmonary effort is normal.  Musculoskeletal:        General: Normal range of motion.  Skin:    General: Skin is warm.  Neurological:     Mental Status: She is alert.     Neurovascular status was found to be intact muscle strength was found to be adequate range of motion was found to be within normal limits.  Patient does have significant damage to underlying nailbeds with the second being the worse than other adjacent nails being also a problem.  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  I do think the second nail should be removed for the long-term and I educated her on permanent nail removal due to the significant structural abnormality.  Today I debrided all nails I instructed on the permanent removal of the second nails and she wants to do this and will schedule at her convenience

## 2021-01-06 DIAGNOSIS — E1169 Type 2 diabetes mellitus with other specified complication: Secondary | ICD-10-CM | POA: Diagnosis not present

## 2021-01-06 DIAGNOSIS — E7800 Pure hypercholesterolemia, unspecified: Secondary | ICD-10-CM | POA: Diagnosis not present

## 2021-01-06 DIAGNOSIS — G4730 Sleep apnea, unspecified: Secondary | ICD-10-CM | POA: Diagnosis not present

## 2021-01-06 DIAGNOSIS — Z0000 Encounter for general adult medical examination without abnormal findings: Secondary | ICD-10-CM | POA: Diagnosis not present

## 2021-01-06 DIAGNOSIS — I10 Essential (primary) hypertension: Secondary | ICD-10-CM | POA: Diagnosis not present

## 2021-01-07 DIAGNOSIS — H5213 Myopia, bilateral: Secondary | ICD-10-CM | POA: Diagnosis not present

## 2021-01-07 DIAGNOSIS — H1045 Other chronic allergic conjunctivitis: Secondary | ICD-10-CM | POA: Diagnosis not present

## 2021-01-07 DIAGNOSIS — D3131 Benign neoplasm of right choroid: Secondary | ICD-10-CM | POA: Diagnosis not present

## 2021-01-07 DIAGNOSIS — E119 Type 2 diabetes mellitus without complications: Secondary | ICD-10-CM | POA: Diagnosis not present

## 2021-02-09 DIAGNOSIS — I10 Essential (primary) hypertension: Secondary | ICD-10-CM | POA: Diagnosis not present

## 2021-02-09 DIAGNOSIS — E1169 Type 2 diabetes mellitus with other specified complication: Secondary | ICD-10-CM | POA: Diagnosis not present

## 2021-02-09 DIAGNOSIS — E7800 Pure hypercholesterolemia, unspecified: Secondary | ICD-10-CM | POA: Diagnosis not present

## 2021-02-10 DIAGNOSIS — H6123 Impacted cerumen, bilateral: Secondary | ICD-10-CM | POA: Diagnosis not present

## 2021-02-17 DIAGNOSIS — D2361 Other benign neoplasm of skin of right upper limb, including shoulder: Secondary | ICD-10-CM | POA: Diagnosis not present

## 2021-02-17 DIAGNOSIS — D235 Other benign neoplasm of skin of trunk: Secondary | ICD-10-CM | POA: Diagnosis not present

## 2021-03-12 DIAGNOSIS — J0101 Acute recurrent maxillary sinusitis: Secondary | ICD-10-CM | POA: Diagnosis not present

## 2021-03-16 DIAGNOSIS — Z6827 Body mass index (BMI) 27.0-27.9, adult: Secondary | ICD-10-CM | POA: Diagnosis not present

## 2021-03-16 DIAGNOSIS — Z1389 Encounter for screening for other disorder: Secondary | ICD-10-CM | POA: Diagnosis not present

## 2021-03-16 DIAGNOSIS — Z01419 Encounter for gynecological examination (general) (routine) without abnormal findings: Secondary | ICD-10-CM | POA: Diagnosis not present

## 2021-05-21 DIAGNOSIS — N3946 Mixed incontinence: Secondary | ICD-10-CM | POA: Diagnosis not present

## 2021-05-21 DIAGNOSIS — R350 Frequency of micturition: Secondary | ICD-10-CM | POA: Diagnosis not present

## 2021-07-09 DIAGNOSIS — I10 Essential (primary) hypertension: Secondary | ICD-10-CM | POA: Diagnosis not present

## 2021-07-09 DIAGNOSIS — Z23 Encounter for immunization: Secondary | ICD-10-CM | POA: Diagnosis not present

## 2021-07-09 DIAGNOSIS — E1169 Type 2 diabetes mellitus with other specified complication: Secondary | ICD-10-CM | POA: Diagnosis not present

## 2021-07-09 DIAGNOSIS — E7800 Pure hypercholesterolemia, unspecified: Secondary | ICD-10-CM | POA: Diagnosis not present

## 2021-08-18 DIAGNOSIS — H6123 Impacted cerumen, bilateral: Secondary | ICD-10-CM | POA: Diagnosis not present

## 2021-09-21 DIAGNOSIS — J3089 Other allergic rhinitis: Secondary | ICD-10-CM | POA: Diagnosis not present

## 2021-09-21 DIAGNOSIS — J301 Allergic rhinitis due to pollen: Secondary | ICD-10-CM | POA: Diagnosis not present

## 2021-09-21 DIAGNOSIS — J310 Chronic rhinitis: Secondary | ICD-10-CM | POA: Diagnosis not present

## 2021-09-23 DIAGNOSIS — E1169 Type 2 diabetes mellitus with other specified complication: Secondary | ICD-10-CM | POA: Diagnosis not present

## 2021-09-23 DIAGNOSIS — E7800 Pure hypercholesterolemia, unspecified: Secondary | ICD-10-CM | POA: Diagnosis not present

## 2021-09-23 DIAGNOSIS — E039 Hypothyroidism, unspecified: Secondary | ICD-10-CM | POA: Diagnosis not present

## 2021-10-01 DIAGNOSIS — Z1231 Encounter for screening mammogram for malignant neoplasm of breast: Secondary | ICD-10-CM | POA: Diagnosis not present

## 2021-10-26 DIAGNOSIS — R300 Dysuria: Secondary | ICD-10-CM | POA: Diagnosis not present

## 2021-12-18 DIAGNOSIS — E1169 Type 2 diabetes mellitus with other specified complication: Secondary | ICD-10-CM | POA: Diagnosis not present

## 2021-12-18 DIAGNOSIS — E039 Hypothyroidism, unspecified: Secondary | ICD-10-CM | POA: Diagnosis not present

## 2021-12-18 DIAGNOSIS — I10 Essential (primary) hypertension: Secondary | ICD-10-CM | POA: Diagnosis not present

## 2021-12-18 DIAGNOSIS — Z780 Asymptomatic menopausal state: Secondary | ICD-10-CM | POA: Diagnosis not present

## 2021-12-18 DIAGNOSIS — E162 Hypoglycemia, unspecified: Secondary | ICD-10-CM | POA: Diagnosis not present

## 2022-01-08 DIAGNOSIS — H5213 Myopia, bilateral: Secondary | ICD-10-CM | POA: Diagnosis not present

## 2022-01-08 DIAGNOSIS — D3131 Benign neoplasm of right choroid: Secondary | ICD-10-CM | POA: Diagnosis not present

## 2022-01-08 DIAGNOSIS — E119 Type 2 diabetes mellitus without complications: Secondary | ICD-10-CM | POA: Diagnosis not present

## 2022-01-08 DIAGNOSIS — H1045 Other chronic allergic conjunctivitis: Secondary | ICD-10-CM | POA: Diagnosis not present

## 2022-01-27 DIAGNOSIS — E1169 Type 2 diabetes mellitus with other specified complication: Secondary | ICD-10-CM | POA: Diagnosis not present

## 2022-01-27 DIAGNOSIS — I10 Essential (primary) hypertension: Secondary | ICD-10-CM | POA: Diagnosis not present

## 2022-01-27 DIAGNOSIS — Z0000 Encounter for general adult medical examination without abnormal findings: Secondary | ICD-10-CM | POA: Diagnosis not present

## 2022-01-27 DIAGNOSIS — E7800 Pure hypercholesterolemia, unspecified: Secondary | ICD-10-CM | POA: Diagnosis not present

## 2022-02-02 DIAGNOSIS — H6123 Impacted cerumen, bilateral: Secondary | ICD-10-CM | POA: Diagnosis not present

## 2022-02-04 DIAGNOSIS — R112 Nausea with vomiting, unspecified: Secondary | ICD-10-CM | POA: Diagnosis not present

## 2022-02-04 DIAGNOSIS — Z1211 Encounter for screening for malignant neoplasm of colon: Secondary | ICD-10-CM | POA: Diagnosis not present

## 2022-02-04 DIAGNOSIS — E1169 Type 2 diabetes mellitus with other specified complication: Secondary | ICD-10-CM | POA: Diagnosis not present

## 2022-02-11 DIAGNOSIS — I10 Essential (primary) hypertension: Secondary | ICD-10-CM | POA: Diagnosis not present

## 2022-02-11 DIAGNOSIS — G4733 Obstructive sleep apnea (adult) (pediatric): Secondary | ICD-10-CM | POA: Diagnosis not present

## 2022-03-02 DIAGNOSIS — Z1211 Encounter for screening for malignant neoplasm of colon: Secondary | ICD-10-CM | POA: Diagnosis not present

## 2022-03-02 DIAGNOSIS — F4321 Adjustment disorder with depressed mood: Secondary | ICD-10-CM | POA: Diagnosis not present

## 2022-03-02 DIAGNOSIS — E1169 Type 2 diabetes mellitus with other specified complication: Secondary | ICD-10-CM | POA: Diagnosis not present

## 2022-03-10 DIAGNOSIS — Z1211 Encounter for screening for malignant neoplasm of colon: Secondary | ICD-10-CM | POA: Diagnosis not present

## 2022-03-10 DIAGNOSIS — E1169 Type 2 diabetes mellitus with other specified complication: Secondary | ICD-10-CM | POA: Diagnosis not present

## 2022-03-10 DIAGNOSIS — M79671 Pain in right foot: Secondary | ICD-10-CM | POA: Diagnosis not present

## 2022-03-22 ENCOUNTER — Encounter: Payer: BC Managed Care – PPO | Admitting: Psychiatry

## 2022-03-22 NOTE — Progress Notes (Deleted)
This encounter was created in error - please disregard.

## 2022-03-22 NOTE — Progress Notes (Signed)
Patient did not show for appt

## 2022-03-23 DIAGNOSIS — D224 Melanocytic nevi of scalp and neck: Secondary | ICD-10-CM | POA: Diagnosis not present

## 2022-03-23 DIAGNOSIS — L812 Freckles: Secondary | ICD-10-CM | POA: Diagnosis not present

## 2022-03-23 DIAGNOSIS — D2361 Other benign neoplasm of skin of right upper limb, including shoulder: Secondary | ICD-10-CM | POA: Diagnosis not present

## 2022-03-24 ENCOUNTER — Ambulatory Visit: Payer: BC Managed Care – PPO | Admitting: Podiatry

## 2022-03-24 ENCOUNTER — Encounter: Payer: Self-pay | Admitting: Podiatry

## 2022-03-24 ENCOUNTER — Ambulatory Visit (INDEPENDENT_AMBULATORY_CARE_PROVIDER_SITE_OTHER): Payer: BC Managed Care – PPO

## 2022-03-24 DIAGNOSIS — M2041 Other hammer toe(s) (acquired), right foot: Secondary | ICD-10-CM

## 2022-03-24 DIAGNOSIS — M2042 Other hammer toe(s) (acquired), left foot: Secondary | ICD-10-CM | POA: Diagnosis not present

## 2022-03-24 DIAGNOSIS — M7751 Other enthesopathy of right foot: Secondary | ICD-10-CM

## 2022-03-24 NOTE — Progress Notes (Signed)
Subjective:   Patient ID: Ashlee Dennis, female   DOB: 51 y.o.   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  Scheduled with Aaron Edelman for orthotic casting  X-rays indicate significant rigid contracture lesser digits right foot over left foot

## 2022-03-25 DIAGNOSIS — E039 Hypothyroidism, unspecified: Secondary | ICD-10-CM | POA: Diagnosis not present

## 2022-03-25 DIAGNOSIS — I10 Essential (primary) hypertension: Secondary | ICD-10-CM | POA: Diagnosis not present

## 2022-03-25 DIAGNOSIS — G4733 Obstructive sleep apnea (adult) (pediatric): Secondary | ICD-10-CM | POA: Diagnosis not present

## 2022-04-02 DIAGNOSIS — E119 Type 2 diabetes mellitus without complications: Secondary | ICD-10-CM | POA: Diagnosis not present

## 2022-04-02 DIAGNOSIS — E039 Hypothyroidism, unspecified: Secondary | ICD-10-CM | POA: Diagnosis not present

## 2022-04-02 DIAGNOSIS — F419 Anxiety disorder, unspecified: Secondary | ICD-10-CM | POA: Diagnosis not present

## 2022-04-02 DIAGNOSIS — R200 Anesthesia of skin: Secondary | ICD-10-CM | POA: Diagnosis not present

## 2022-04-02 DIAGNOSIS — Z7984 Long term (current) use of oral hypoglycemic drugs: Secondary | ICD-10-CM | POA: Diagnosis not present

## 2022-04-02 DIAGNOSIS — R202 Paresthesia of skin: Secondary | ICD-10-CM | POA: Diagnosis not present

## 2022-04-02 DIAGNOSIS — E0590 Thyrotoxicosis, unspecified without thyrotoxic crisis or storm: Secondary | ICD-10-CM | POA: Diagnosis not present

## 2022-04-02 DIAGNOSIS — E785 Hyperlipidemia, unspecified: Secondary | ICD-10-CM | POA: Diagnosis not present

## 2022-04-02 DIAGNOSIS — R457 State of emotional shock and stress, unspecified: Secondary | ICD-10-CM | POA: Diagnosis not present

## 2022-04-05 ENCOUNTER — Ambulatory Visit: Payer: BC Managed Care – PPO

## 2022-04-05 DIAGNOSIS — E039 Hypothyroidism, unspecified: Secondary | ICD-10-CM | POA: Diagnosis not present

## 2022-04-05 DIAGNOSIS — M2041 Other hammer toe(s) (acquired), right foot: Secondary | ICD-10-CM

## 2022-04-05 DIAGNOSIS — F419 Anxiety disorder, unspecified: Secondary | ICD-10-CM | POA: Diagnosis not present

## 2022-04-05 DIAGNOSIS — M7751 Other enthesopathy of right foot: Secondary | ICD-10-CM | POA: Diagnosis not present

## 2022-04-05 DIAGNOSIS — M7752 Other enthesopathy of left foot: Secondary | ICD-10-CM | POA: Diagnosis not present

## 2022-04-05 DIAGNOSIS — M7741 Metatarsalgia, right foot: Secondary | ICD-10-CM | POA: Diagnosis not present

## 2022-04-07 DIAGNOSIS — F419 Anxiety disorder, unspecified: Secondary | ICD-10-CM | POA: Diagnosis not present

## 2022-04-07 DIAGNOSIS — E039 Hypothyroidism, unspecified: Secondary | ICD-10-CM | POA: Diagnosis not present

## 2022-04-23 DIAGNOSIS — F322 Major depressive disorder, single episode, severe without psychotic features: Secondary | ICD-10-CM | POA: Diagnosis not present

## 2022-04-23 DIAGNOSIS — E039 Hypothyroidism, unspecified: Secondary | ICD-10-CM | POA: Diagnosis not present

## 2022-04-23 DIAGNOSIS — F419 Anxiety disorder, unspecified: Secondary | ICD-10-CM | POA: Diagnosis not present

## 2022-04-23 DIAGNOSIS — H9190 Unspecified hearing loss, unspecified ear: Secondary | ICD-10-CM | POA: Diagnosis not present

## 2022-04-27 DIAGNOSIS — Z1389 Encounter for screening for other disorder: Secondary | ICD-10-CM | POA: Diagnosis not present

## 2022-04-27 DIAGNOSIS — E7800 Pure hypercholesterolemia, unspecified: Secondary | ICD-10-CM | POA: Diagnosis not present

## 2022-04-27 DIAGNOSIS — E1169 Type 2 diabetes mellitus with other specified complication: Secondary | ICD-10-CM | POA: Diagnosis not present

## 2022-04-27 DIAGNOSIS — F419 Anxiety disorder, unspecified: Secondary | ICD-10-CM | POA: Diagnosis not present

## 2022-04-27 DIAGNOSIS — I10 Essential (primary) hypertension: Secondary | ICD-10-CM | POA: Diagnosis not present

## 2022-04-30 ENCOUNTER — Ambulatory Visit (INDEPENDENT_AMBULATORY_CARE_PROVIDER_SITE_OTHER): Payer: BC Managed Care – PPO | Admitting: Adult Health

## 2022-04-30 ENCOUNTER — Encounter: Payer: Self-pay | Admitting: Adult Health

## 2022-04-30 VITALS — BP 152/80 | HR 112 | Ht 64.0 in | Wt 131.0 lb

## 2022-04-30 DIAGNOSIS — F411 Generalized anxiety disorder: Secondary | ICD-10-CM

## 2022-04-30 MED ORDER — LORAZEPAM 0.5 MG PO TABS: ORAL_TABLET | ORAL | 1 refills | Status: DC

## 2022-04-30 MED ORDER — ESCITALOPRAM OXALATE 10 MG PO TABS: 10.0000 mg | ORAL_TABLET | Freq: Every day | ORAL | 2 refills | Status: DC

## 2022-04-30 NOTE — Progress Notes (Signed)
[facility] MD/PA/NP Initial Note  04/30/2022 12:38 PM Ashlee Dennis  MRN:  [id number]  Chief Complaint:   HPI:  Patient seen today for initial psychiatric evaluation.  Referred by [facility] physicians - started on Lexapro 10 - 2 days ago.  Describes mood today as "not the best". Pleasant. Reports tearfulness - tearful throughout interview. Mood symptoms - denies depression. Feels overwhelmed - report anxiety and irritability. Reports worry and rumination. Mood is typically consistent, but is struggling currently. Recently seen at the ED in [facility] for "overwhelming" anxiety. She and husband were vacationing at [place] - she felt more anxious than usual and was unable to manage it. Decided to call a friend who was at another beach close by to come and get her and take her to the emergency room. While in the emergency room, was given Lorazepam to help calm her. The medication was helpful and she was given enough until she could be seen locally. She was also seen at [facility] and started on Lexapro 10mg  daily. Patient reporting increased stressors. She and husband married, after being together for several years, after finding out he had ALS and was expected he would pass away. He was misdiagnosed and did not pass away - but still has multiple medical issues and requires a lot of assistance. Husband doesn't want to hold her her back, but she feels trapped. Stating "I went from coming and going as I please to not. Stating "this is really hard". Lost both parents a year ago. Had to put parents cat down yesterday. Bought parents house for siblings. She and husband are living in the living room of their current house for now due to his disabilities. Has been renovating parents home and plans to move there on July 31st. Decreased interest and motivation. Taking medications as prescribed.  Energy levels usually stable. Active, does not have a regular exercise routine.   Enjoys some usual interests  and activities. Married. Lives with husband of 23 years. Family local. Spending time with family. Appetite adequate. Weight loss - 30 pounds with medication. Sleeps well most nights. Averages 6 to 7 hours. Has a CPAP, but has difficulties using it. Focus and concentration stable. 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 Completing tasks. Managing aspects of household. 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 Worked as a [employer].  Denies SI or HI.  Denies AH or VH. Denies self harm. Denies substance use.  Previous medication trials:  Lorazepam, Lexapro  Visit Diagnosis:    ICD-10-CM   1. Generalized anxiety disorder  F41.1 escitalopram (LEXAPRO) 10 MG tablet    LORazepam (ATIVAN) 0.5 MG tablet      Past Psychiatric History: Denies psychiatric hospitalization.   Past Medical History:  Past Medical History:  Diagnosis Date   Allergy    Diabetes mellitus without complication (HCC)    GERD (gastroesophageal reflux disease)    Migraine    Thyroid disease     Past Surgical History:  Procedure Laterality Date   ABDOMINAL HYSTERECTOMY     BREAST SURGERY     Cancer   KNEE ARTHROSCOPY     Teeth Implants     x 2    Family Psychiatric History: Family history of mental illness - uncle.   Family History:  Family History  Problem Relation Age of Onset   Cancer Brother 27       renal cell carcinoma   Migraines Mother    Seizures Mother    Skin cancer Father  Thyroid disease Sister    Thyroid disease Sister    Colon cancer Neg Hx    Stomach cancer Neg Hx    Rectal cancer Neg Hx    Esophageal cancer Neg Hx    Liver cancer Neg Hx     Social History:  Social History   Socioeconomic History   Marital status: Married    Spouse name: Not on file   Number of children: 0   Years of education: college   Highest education level: Not on file  Occupational History   Occupation: substitute Magazine features editor: [employer]  Tobacco Use   Smoking status: Never   Smokeless tobacco:  Never  Vaping Use   Vaping Use: Never used  Substance and Sexual Activity   Alcohol use: No    Alcohol/week: 0.0 standard drinks of alcohol   Drug use: No   Sexual activity: Yes    Partners: Male    Birth control/protection: Condom  Other Topics Concern   Not on file  Social History Narrative   Lives with her boyfriend.   Right-handed.   2-3 cups caffeine per day.   Social Determinants of Health   Financial Resource Strain: Not on file  Food Insecurity: Not on file  Transportation Needs: Not on file  Physical Activity: Not on file  Stress: Not on file  Social Connections: Not on file    Allergies:  Allergies  Allergen Reactions   Asa [Aspirin] Other (See Comments)    GERD   Aspartame And Phenylalanine     Joint aches   Chocolate Flavor     Other reaction(s): excessive belching   Codeine Hives   Dextroamphetamine Hives   Diclofenac Sodium Hives    Other reaction(s): hives   Erythromycin Hives   Metformin And Related Hives   Mint Chocolate Chip Flavor    Other     Splenda, Stevia, Truvia   Oxycodone Hives   Oxycodone-Acetaminophen     Other reaction(s): vomiting   Penicillins Hives   Pennsaid [Diclofenac Sodium] Hives   Empagliflozin Nausea And Vomiting    Other reaction(s): diarrhea    Metabolic Disorder Labs: Lab Results  Component Value Date   HGBA1C (H) 10/26/2010    6.2 (NOTE)                                                                       According to the ADA Clinical Practice Recommendations for 2011, when HbA1c is used as a screening test:   >=6.5%   Diagnostic of Diabetes Mellitus           (if abnormal result  is confirmed)  5.7-6.4%   Increased risk of developing Diabetes Mellitus  References:Diagnosis and Classification of Diabetes Mellitus,Diabetes Care,2011,34(Suppl 1):S62-S69 and Standards of Medical Care in         Diabetes - 2011,Diabetes Care,2011,34  (Suppl 1):S11-S61.   MPG 131 (H) 10/26/2010   No results found for:  "PROLACTIN" No results found for: "CHOL", "TRIG", "HDL", "CHOLHDL", "VLDL", "LDLCALC" Lab Results  Component Value Date   TSH 4.381 10/26/2010    Therapeutic Level Labs: No results found for: "LITHIUM" No results found for: "VALPROATE" No results found for: "CBMZ"  Current Medications: Current Outpatient Medications  Medication Sig Dispense Refill   LORazepam (ATIVAN) 0.5 MG tablet Take one tablet four times daily for increased anxiety. (2 week supply). 60 tablet 1   escitalopram (LEXAPRO) 10 MG tablet Take 1 tablet (10 mg total) by mouth daily. 30 tablet 2   fexofenadine (ALLEGRA) 180 MG tablet Take 180 mg by mouth daily.     fluticasone (FLONASE) 50 MCG/ACT nasal spray as needed.  3   glimepiride (AMARYL) 2 MG tablet daily.  12   levothyroxine (SYNTHROID, LEVOTHROID) 75 MCG tablet Take 75 mcg by mouth daily.     losartan (COZAAR) 50 MG tablet Take 1 tablet by mouth daily.     montelukast (SINGULAIR) 10 MG tablet Take 10 mg by mouth daily.     pantoprazole (PROTONIX) 40 MG tablet Take 1 tablet by mouth daily.  1   SUMAtriptan (IMITREX) 100 MG tablet Take 1 tablet (100 mg total) by mouth once as needed for up to 1 dose for migraine. May repeat in 2 hours if headache persists or recurs. 10 tablet 6   No current facility-administered medications for this visit.    Medication Side Effects: none  Orders placed this visit:  No orders of the defined types were placed in this encounter.   Psychiatric Specialty Exam:  Review of Systems  Musculoskeletal:  Negative for gait problem.  Neurological:  Negative for tremors.  Psychiatric/Behavioral:         Please refer to HPI    Blood pressure (!) 152/80, pulse (!) 112, height 5\' 4"  (1.626 m).Body mass index is 28.49 kg/m.  General Appearance: Casual and Neat  Eye Contact:  Good  Speech:  Clear and Coherent and Normal Rate  Volume:  Normal  Mood:  Anxious  Affect:  Appropriate, Congruent, Tearful, and Anxious  Thought Process:   Coherent and Descriptions of Associations: Intact  Orientation:  Full (Time, Place, and Person)  Thought Content: Logical   Suicidal Thoughts:  No  Homicidal Thoughts:  No  Memory:  WNL  Judgement:  Good  Insight:  Good  Psychomotor Activity:  Normal  Concentration:  Concentration: Good and Attention Span: Good  Recall:  Good  Fund of Knowledge: Good  Language: Good  Assets:  Communication Skills Desire for Improvement Financial Resources/Insurance Housing Intimacy Leisure Time Physical Health Resilience Social Support Talents/Skills Transportation Vocational/Educational  ADL's:  Intact  Cognition: WNL  Prognosis:  Good   Screenings:  PHQ2-9    Flowsheet Row Office Visit from 02/25/2017 in Primary Care at [place] Office Visit from 05/05/2016 in Primary Care at [facility] Visit from 08/19/2015 in Primary Care at [place]  PHQ-2 Total Score 0 0 0       Receiving Psychotherapy: Yes   Treatment Plan/Recommendations:  Plan:  PDMP reviewed  Add Lorazepam 0.5mg  - 4 times daily for now - anxiety Continue Lexapro 10mg  - started 2 days ago  RTC 2 weeks  Patient advised to contact office with any questions, adverse effects, or acute worsening in signs and symptoms.    Time spent with patient was 60 minutes. Greater than 50% of face to face time with patient was spent on counseling and coordination of care.     13/05/2015, NP

## 2022-05-04 DIAGNOSIS — Z1211 Encounter for screening for malignant neoplasm of colon: Secondary | ICD-10-CM | POA: Diagnosis not present

## 2022-05-04 DIAGNOSIS — Z01419 Encounter for gynecological examination (general) (routine) without abnormal findings: Secondary | ICD-10-CM | POA: Diagnosis not present

## 2022-05-14 ENCOUNTER — Ambulatory Visit (INDEPENDENT_AMBULATORY_CARE_PROVIDER_SITE_OTHER): Payer: BC Managed Care – PPO | Admitting: Adult Health

## 2022-05-14 ENCOUNTER — Encounter: Payer: Self-pay | Admitting: Adult Health

## 2022-05-14 DIAGNOSIS — F411 Generalized anxiety disorder: Secondary | ICD-10-CM

## 2022-05-14 MED ORDER — ESCITALOPRAM OXALATE 10 MG PO TABS: 10.0000 mg | ORAL_TABLET | Freq: Every day | ORAL | 2 refills | Status: DC

## 2022-05-14 MED ORDER — LORAZEPAM 0.5 MG PO TABS: ORAL_TABLET | ORAL | 2 refills | Status: DC

## 2022-05-14 NOTE — Progress Notes (Signed)
Ashlee Dennis [id number] 1971-02-25 51 y.o.  Subjective:   Patient ID:  Ashlee Dennis is a 51 y.o. (DOB December 28, 1970) female.  Chief Complaint: No chief complaint on file.   HPI Ashlee Dennis presents to the office today for follow-up of GAD.  Describes mood today as "ok". Pleasant. Decreased tearfulness.Mood symptoms - reports decreased depression, anxiety, and irritability. Reports decreased worry and rumination. Denies any over thinking. Mood has improved. Stating "I feel like I'm a lot better". Feels like the medications are helpful - taking as prescribed. Improved. interest and motivation. Taking medications as prescribed.  Energy levels usually stable. Active, does not have a regular exercise routine.   Enjoys some usual interests and activities. Married. Lives with husband of 23 years. Family local. Spending time with family. Appetite adequate. Weight stable - pounds with medication. Sleeps well most nights. Averages 6 to 7 hours. Has a CPAP, but has difficulties using it. Focus and concentration stable. 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 Completing tasks. Managing aspects of household. 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  Denies SI or HI.  Denies AH or VH. Denies self harm. Denies substance use.  Previous medication trials:  Lorazepam, Lexapro   PHQ2-9    Flowsheet Row Office Visit from 02/25/2017 in Primary Care at [facility] Visit from 05/05/2016 in Primary Care at [facility] Visit from 08/19/2015 in Primary Care at [facility] Total Score 0 0 0        Review of Systems:  Review of Systems  Musculoskeletal:  Negative for gait problem.  Neurological:  Negative for tremors.  Psychiatric/Behavioral:         Please refer to HPI    Medications: I have reviewed the patient's current medications.  Current Outpatient Medications  Medication Sig Dispense Refill   escitalopram (LEXAPRO) 10 MG tablet Take 1 tablet (10 mg total) by mouth daily. 30  tablet 2   fexofenadine (ALLEGRA) 180 MG tablet Take 180 mg by mouth daily.     fluticasone (FLONASE) 50 MCG/ACT nasal spray as needed.  3   glimepiride (AMARYL) 2 MG tablet daily.  12   levothyroxine (SYNTHROID, LEVOTHROID) 75 MCG tablet Take 75 mcg by mouth daily.     LORazepam (ATIVAN) 0.5 MG tablet Take one tablet four times daily for increased anxiety. 120 tablet 2   losartan (COZAAR) 50 MG tablet Take 1 tablet by mouth daily.     montelukast (SINGULAIR) 10 MG tablet Take 10 mg by mouth daily.     pantoprazole (PROTONIX) 40 MG tablet Take 1 tablet by mouth daily.  1   SUMAtriptan (IMITREX) 100 MG tablet Take 1 tablet (100 mg total) by mouth once as needed for up to 1 dose for migraine. May repeat in 2 hours if headache persists or recurs. 10 tablet 6   No current facility-administered medications for this visit.    Medication Side Effects: None  Allergies:  Allergies  Allergen Reactions   Asa [Aspirin] Other (See Comments)    GERD   Aspartame And Phenylalanine     Joint aches   Chocolate Flavor     Other reaction(s): excessive belching   Codeine Hives   Dextroamphetamine Hives   Diclofenac Sodium Hives    Other reaction(s): hives   Erythromycin Hives   Metformin And Related Hives   Mint Chocolate Chip Flavor    Other     Splenda, Stevia, Truvia   Oxycodone Hives   Oxycodone-Acetaminophen     Other  reaction(s): vomiting   Penicillins Hives   Pennsaid [Diclofenac Sodium] Hives   Empagliflozin Nausea And Vomiting    Other reaction(s): diarrhea    Past Medical History:  Diagnosis Date   Allergy    Diabetes mellitus without complication (HCC)    GERD (gastroesophageal reflux disease)    Migraine    Thyroid disease     Past Medical History, Surgical history, Social history, and Family history were reviewed and updated as appropriate.   Please see review of systems for further details on the patient's review from today.   Objective:   Physical Exam:  There  were no vitals taken for this visit.  Physical Exam Constitutional:      General: She is not in acute distress. Musculoskeletal:        General: No deformity.  Neurological:     Mental Status: She is alert and oriented to person, place, and time.     Coordination: Coordination normal.  Psychiatric:        Attention and Perception: Attention and perception normal. She does not perceive auditory or visual hallucinations.        Mood and Affect: Mood normal. Mood is not anxious or depressed. Affect is not labile, blunt, angry or inappropriate.        Speech: Speech normal.        Behavior: Behavior normal.        Thought Content: Thought content normal. Thought content is not paranoid or delusional. Thought content does not include homicidal or suicidal ideation. Thought content does not include homicidal or suicidal plan.        Cognition and Memory: Cognition and memory normal.        Judgment: Judgment normal.     Comments: Insight intact     Lab Review:     Component Value Date/Time   NA 138 11/16/2017 1532   K 3.4 (L) 11/16/2017 1532   CL 100 (L) 11/16/2017 1532   CO2 19 (L) 11/16/2017 1523   GLUCOSE 235 (H) 11/16/2017 1532   BUN 13 11/16/2017 1532   CREATININE 0.50 11/16/2017 1532   CALCIUM 9.2 11/16/2017 1523   PROT 7.4 11/16/2017 1523   ALBUMIN 4.4 11/16/2017 1523   AST 26 11/16/2017 1523   ALT 21 11/16/2017 1523   ALKPHOS 50 11/16/2017 1523   BILITOT 0.5 11/16/2017 1523   GFRNONAA >60 11/16/2017 1523   GFRAA >60 11/16/2017 1523       Component Value Date/Time   WBC 14.6 (H) 11/16/2017 1523   RBC 5.00 11/16/2017 1523   HGB 15.0 11/16/2017 1532   HCT 44.0 11/16/2017 1532   PLT 289 11/16/2017 1523   MCV 85.8 11/16/2017 1523   MCH 29.6 11/16/2017 1523   MCHC 34.5 11/16/2017 1523   RDW 12.0 11/16/2017 1523   LYMPHSABS 1.4 11/16/2017 1523   MONOABS 0.7 11/16/2017 1523   EOSABS 0.0 11/16/2017 1523   BASOSABS 0.0 11/16/2017 1523    No results found for:  "POCLITH", "LITHIUM"   No results found for: "PHENYTOIN", "PHENOBARB", "VALPROATE", "CBMZ"   .res Assessment: Plan:    Plan:  PDMP reviewed  Continue Lorazepam 0.5mg  - 4 times daily for now - anxiety Continue Lexapro 10mg    RTC 2 weeks  Patient advised to contact office with any questions, adverse effects, or acute worsening in signs and symptoms.    Time spent with patient was 20 minutes. Greater than 50% of face to face time with patient was spent on counseling and coordination  of care.    Diagnoses and all orders for this visit:  Generalized anxiety disorder -     LORazepam (ATIVAN) 0.5 MG tablet; Take one tablet four times daily for increased anxiety. -     escitalopram (LEXAPRO) 10 MG tablet; Take 1 tablet (10 mg total) by mouth daily.     Please see After Visit Summary for patient specific instructions.  Future Appointments  Date Time Provider Department Center  05/17/2022 11:15 AM [facility]  06/07/2022 11:00 AM Mathis Fare, LCSW [facility] None  06/18/2022 11:20 AM Kennieth Plotts, Thereasa Solo, NP [facility] None  10/18/2022  8:30 AM Shamleffer, Konrad Dolores, MD [facility] None    No orders of the defined types were placed in this encounter.   -------------------------------

## 2022-05-17 ENCOUNTER — Ambulatory Visit (INDEPENDENT_AMBULATORY_CARE_PROVIDER_SITE_OTHER): Payer: BC Managed Care – PPO | Admitting: *Deleted

## 2022-05-17 DIAGNOSIS — M7751 Other enthesopathy of right foot: Secondary | ICD-10-CM

## 2022-05-17 NOTE — Progress Notes (Signed)
Patient presents today to pick up custom molded foot orthotics, diagnosed with capsulitis by Dr. Charlsie Merles.   Orthotics were dispensed and fit was satisfactory. Reviewed instructions for break-in and wear. Written instructions given to patient.  Patient will follow up as needed.   [facility] - order # [id number]

## 2022-05-18 DIAGNOSIS — F419 Anxiety disorder, unspecified: Secondary | ICD-10-CM | POA: Diagnosis not present

## 2022-05-18 DIAGNOSIS — E1169 Type 2 diabetes mellitus with other specified complication: Secondary | ICD-10-CM | POA: Diagnosis not present

## 2022-05-19 DIAGNOSIS — R351 Nocturia: Secondary | ICD-10-CM | POA: Diagnosis not present

## 2022-05-19 DIAGNOSIS — N3946 Mixed incontinence: Secondary | ICD-10-CM | POA: Diagnosis not present

## 2022-05-30 LAB — COLOGUARD

## 2022-06-01 DIAGNOSIS — H6123 Impacted cerumen, bilateral: Secondary | ICD-10-CM | POA: Diagnosis not present

## 2022-06-07 ENCOUNTER — Ambulatory Visit (INDEPENDENT_AMBULATORY_CARE_PROVIDER_SITE_OTHER): Payer: BC Managed Care – PPO | Admitting: Psychiatry

## 2022-06-07 DIAGNOSIS — F411 Generalized anxiety disorder: Secondary | ICD-10-CM | POA: Diagnosis not present

## 2022-06-07 NOTE — Progress Notes (Signed)
[facility] Counsel[facility] Initial Adult Exam  Name: Ashlee Dennis Date: 06/07/2022 MRN: [id number] DOB: 09-27-1971 PCP: Renford Dills, MD  Time spent: 60 minutes   Guardian/Payee:  Patient    Paperwork requested:  No   Reason for Visit /Presenting Problem:  anxiety  Mental Status Exam:    Appearance:   Casual     Behavior:  Appropriate, Sharing, and Motivated  Motor:  Normal  Speech/Language:   Clear and Coherent  Affect:  anxious  Mood:  anxious  Thought process:  goal directed  Thought content:    overthinking  Sensory/Perceptual disturbances:    WNL  Orientation:  oriented to person, place, time/date, situation, day of week, month of year, year, and stated date of June 07, 2022  Attention:  Good  Concentration:  Fair  Memory:  WNL  Fund of knowledge:   Good  Insight:    Fair  Judgment:   Good  Impulse Control:  Good   Reported Symptoms:  see symptoms noted above  Risk Assessment: Danger to Self:  No Self-injurious Behavior: No Danger to Others: No Duty to Warn:no Physical Aggression / Violence:No  Access to Firearms a concern: No  Gang Involvement:No  Patient / guardian was educated about steps to take if suicide or homicide risk level increases between visits: denies any SI. While future psychiatric events cannot be accurately predicted, the patient does not currently require acute inpatient psychiatric care and does not currently meet [facility] involuntary commitment criteria.  Substance Abuse History: Current substance abuse: No     Past Psychiatric History:   Previous psychological history is significant for anxiety Outpatient Providers: Jamelle Haring, PH.D History of Psych Hospitalization: No  Psychological Testing:  none    Abuse History: Victim of No.,  n/a    Report needed: No. Victim of Neglect:No. Perpetrator of  n/a   Witness / Exposure to Domestic Violence: No   Protective Services Involvement: No  Witness to [employer] Violence:  No    Family History: Reviewed and patient confirms info below. Family History  Problem Relation Age of Onset   Cancer Brother 44       renal cell carcinoma   Migraines Mother    Seizures Mother    Skin cancer Father    Thyroid disease Sister    Thyroid disease Sister    Colon cancer Neg Hx    Stomach cancer Neg Hx    Rectal cancer Neg Hx    Esophageal cancer Neg Hx    Liver cancer Neg Hx     Living situation: the patient lives with their "spouse and 2 cats".  Married first time for 7 yrs..Married second time for almost 3 yrs and feels positive about this marriage, with husband being  87 yrs (23 yrs older than patient).  Sexual Orientation:  Straight  Relationship Status: married  Name of spouse / other:married almost 3 yrs             If a parent, number of children / ages:patient has not children and patient has 1 adult child age 42 who is divorced with 3 sons and "hates [place] cureently" and not speaking to patient and husband. Patient's parents died 11 months apart in 02/12/20 and Feb 11, 2021. Mom died of internal bleeding and father died of cancer. Since meeting patient, husband has had skin cancer, diabetes and liver issues and is a potential fall risk.  Support Systems; spouse friends  Financial Stress:  No   Income/Employment/Disability: not working, "taking care of  husband".  Military Service: No   Educational History: Education: Water quality scientist:   Catholic and involved in a church  Any cultural differences that may affect / interfere with treatment:  not applicable   Recreation/Hobbies: collects dolls and spoons from places I travel  Stressors:panic due to history of feeling like my body went numb at [employer], stressed in caring for husband "although not disabled"  Strengths:  Supportive Relationships, Family, Friends, Spirituality, Hopefulness, Journalist, newspaper, and Able to Communicate Effectively  Barriers:   "not taking time for  myself"  Legal History: Pending legal issue / charges: The patient has no significant history of legal issues. History of legal issue / charges:  n/a  Medical History/Surgical History:Reviewed and patient confirms the medical info below. Past Medical History:  Diagnosis Date   Allergy    Diabetes mellitus without complication (HCC)    GERD (gastroesophageal reflux disease)    Migraine    Thyroid disease     Past Surgical History:  Procedure Laterality Date   ABDOMINAL HYSTERECTOMY     BREAST SURGERY     Cancer   KNEE ARTHROSCOPY     Teeth Implants     x 2    Medications:Confirmed info below with patient. Current Outpatient Medications  Medication Sig Dispense Refill   escitalopram (LEXAPRO) 10 MG tablet Take 1 tablet (10 mg total) by mouth daily. 30 tablet 2   fexofenadine (ALLEGRA) 180 MG tablet Take 180 mg by mouth daily.     fluticasone (FLONASE) 50 MCG/ACT nasal spray as needed.  3   glimepiride (AMARYL) 2 MG tablet daily.  12   levothyroxine (SYNTHROID, LEVOTHROID) 75 MCG tablet Take 75 mcg by mouth daily.     LORazepam (ATIVAN) 0.5 MG tablet Take one tablet four times daily for increased anxiety. 120 tablet 2   losartan (COZAAR) 50 MG tablet Take 1 tablet by mouth daily.     montelukast (SINGULAIR) 10 MG tablet Take 10 mg by mouth daily.     pantoprazole (PROTONIX) 40 MG tablet Take 1 tablet by mouth daily.  1   SUMAtriptan (IMITREX) 100 MG tablet Take 1 tablet (100 mg total) by mouth once as needed for up to 1 dose for migraine. May repeat in 2 hours if headache persists or recurs. 10 tablet 6   No current facility-administered medications for this visit.    Allergies  Allergen Reactions   Asa [Aspirin] Other (See Comments)    GERD   Aspartame And Phenylalanine     Joint aches   Chocolate Flavor     Other reaction(s): excessive belching   Codeine Hives   Dextroamphetamine Hives   Diclofenac Sodium Hives    Other reaction(s): hives   Erythromycin Hives    Metformin And Related Hives   Mint Chocolate Chip Flavor    Other     Splenda, Stevia, Truvia   Oxycodone Hives   Oxycodone-Acetaminophen     Other reaction(s): vomiting   Penicillins Hives   Pennsaid [Diclofenac Sodium] Hives   Empagliflozin Nausea And Vomiting    Other reaction(s): diarrhea    Diagnoses:    ICD-10-CM   1. Generalized anxiety disorder  F41.1       Treatment goal plan: Patient not signing treatment goal plan on computer screen due to COVID. Treatment goals: Treatment goals remain on treatment plan as patient works with strategies to achieve her goals.  Progress is assessed each session and noted in the "subjective" and/or "plan" sections of  treatment note. Long-term goal: Reduce overall level, frequency, and intensity of the anxiety so that daily functioning is not impaired. Short-term goal: Describe current and past experiences with specific fears, prominent worries, and anxiety symptoms including their impact on functioning and attempts to resolve it.  Strategies: Identify, challenge, and replace fearful/anxious self-talk with positive, realistic, and empowering self-talk.    Plan of Care: This is patient's first session with this therapist and today we completed her initial evaluation for treatment as well as her initial treatment goal plan collaboratively.  Reports her main symptom is anxiety.  Denies any SI.  Patient is a 51 year old married female whose current husband is 56 years old.  She states that she and husband "live in our own home with 2 cats".  Reports 2 previous marriages with the first 1 lasting for 7 years, a second 1 lasting for almost 3 years, "but am feeling positive about my current marriage" with husband who is 23 years older than she.  Patient adds that her husband has 1 adult child, age 4, who is divorced with 3 sons and "hates [place] currently" and is not speaking to patient and her husband.  Patient's parents died 11 months apart in 01-31-20 and  01-30-2021.  She reports mother died of internal bleeding and father died of cancer.  Since meeting her current husband, patient states husband has had skin cancer, diabetes, and liver issues in addition to being a potential fall risk.  She reports that someone is with him most of the time and a lot of the time is herself as she does not work outside the home.  She reports no financial stressors.  Her support system includes her spouse and a few friends.  She denies any current substance abuse.  Identifies as a Catholic and is involved in her church which she finds helpful.  Reports her stressors to be coming from a panic situation she had when she was at the beach and "felt like my body went numb" which he reports led to her getting treatment currently for anxiety.  She reports her strengths as being supportive relationships, family, friends, her sense of spirituality, hopefulness, and is a good self advocate.  She reports her barriers to treatment to be "not taking time for myself".  As far as mental status exam, patient's appearance is casual, behavior is appropriate and motivated, motor skills normal, speech and language are clear and coherent, affect and mood are both anxious, thought process is goal directed, thought content includes some overthinking, sensory/perceptual disturbances is WNL, is well oriented to person/place/date/time/situation/day of week/month of year/ and stated date of June 07, 2022.  Her attention is good, concentration is fair, memory is WNL, insight is fair, judgment and impulse control are both good.  She denies any thoughts to harm self or others.  Discussed goals with patient as we developed her treatment goal plan based primarily around her current issues with anxiety.  Did add near end of session that she has had at least a couple therapist over a long period of time who eventually retired.  Further information on this patient including mental status exam, family history, personal  history, risk assessment, and medical history can all be found in the above sections of this full initial evaluation.  Patient in agreement with initial treatment goals and appearing motivated for treatment.  Next appointment within 2 weeks.  This record has been created using [employer].  Chart creation errors have been sought, but may not always  have been located and corrected.  Such creation errors do not reflect on the standard of medical care provided.   Mathis Fare, LCSW

## 2022-06-16 DIAGNOSIS — F419 Anxiety disorder, unspecified: Secondary | ICD-10-CM | POA: Diagnosis not present

## 2022-06-16 DIAGNOSIS — E1169 Type 2 diabetes mellitus with other specified complication: Secondary | ICD-10-CM | POA: Diagnosis not present

## 2022-06-18 ENCOUNTER — Encounter: Payer: Self-pay | Admitting: Adult Health

## 2022-06-18 ENCOUNTER — Telehealth (INDEPENDENT_AMBULATORY_CARE_PROVIDER_SITE_OTHER): Payer: BC Managed Care – PPO | Admitting: Adult Health

## 2022-06-18 DIAGNOSIS — F411 Generalized anxiety disorder: Secondary | ICD-10-CM

## 2022-06-18 MED ORDER — LORAZEPAM 0.5 MG PO TABS: ORAL_TABLET | ORAL | 2 refills | Status: DC

## 2022-06-18 MED ORDER — ESCITALOPRAM OXALATE 10 MG PO TABS: 10.0000 mg | ORAL_TABLET | Freq: Every day | ORAL | 2 refills | Status: DC

## 2022-06-18 NOTE — Progress Notes (Signed)
Ashlee Dennis [id number] 01-Jan-1971 51 y.o.  Virtual Visit via Video Note  I connected with pt @ on 06/18/22 at 11:20 AM EDT by a video enabled telemedicine application and verified that I am speaking with the correct person using two identifiers.   I discussed the limitations of evaluation and management by telemedicine and the availability of in person appointments. The patient expressed understanding and agreed to proceed.  I discussed the assessment and treatment plan with the patient. The patient was provided an opportunity to ask questions and all were answered. The patient agreed with the plan and demonstrated an understanding of the instructions.   The patient was advised to call back or seek an in-person evaluation if the symptoms worsen or if the condition fails to improve as anticipated.  I provided 10 minutes of non-face-to-face time during this encounter.  The patient was located at home.  The provider was located at [facility].   Ashlee Gibbs, NP   Subjective:   Patient ID:  Ashlee Dennis is a 51 y.o. (DOB 1971/03/25) female.  Chief Complaint: No chief complaint on file.   HPI Ashlee Dennis presents for follow-up of GAD.  Describes mood today as "ok". Pleasant. Decreased tearfulness. Mood symptoms - reports decreased depression, anxiety, and irritability. Reports decreased worry and rumination. Denies over thinking. Mood has improved. Stating "I feel like I'm doing ok". Feels like the medications are helpful and would like to continue. Has reduced the Lorazepam 0.5mg  - 4 times daily to three times daily. Improved. interest and motivation. Taking medications as prescribed.  Energy levels improved. Active, has a regular exercise routine - swimming.   Enjoys some usual interests and activities. Married. Lives with husband of 23 years. Family local. Spending time with family. Appetite adequate. Weight stable - 134 pounds. Sleeps well most nights. Averages 6 to 7 hours.  Has a CPAP, not using it. Focus and concentration stable. 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 Completing tasks. Managing aspects of household. 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  Denies SI or HI.  Denies AH or VH. Denies self harm. Denies substance use.  Previous medication trials:  Lorazepam, Lexapro   Review of Systems:  Review of Systems  Musculoskeletal:  Negative for gait problem.  Neurological:  Negative for tremors.  Psychiatric/Behavioral:         Please refer to HPI    Medications: I have reviewed the patient's current medications.  Current Outpatient Medications  Medication Sig Dispense Refill   escitalopram (LEXAPRO) 10 MG tablet Take 1 tablet (10 mg total) by mouth daily. 30 tablet 2   fexofenadine (ALLEGRA) 180 MG tablet Take 180 mg by mouth daily.     fluticasone (FLONASE) 50 MCG/ACT nasal spray as needed.  3   glimepiride (AMARYL) 2 MG tablet daily.  12   levothyroxine (SYNTHROID, LEVOTHROID) 75 MCG tablet Take 75 mcg by mouth daily.     LORazepam (ATIVAN) 0.5 MG tablet Take one tablet four times daily for increased anxiety. 120 tablet 2   losartan (COZAAR) 50 MG tablet Take 1 tablet by mouth daily.     montelukast (SINGULAIR) 10 MG tablet Take 10 mg by mouth daily.     pantoprazole (PROTONIX) 40 MG tablet Take 1 tablet by mouth daily.  1   SUMAtriptan (IMITREX) 100 MG tablet Take 1 tablet (100 mg total) by mouth once as needed for up to 1 dose for migraine. May repeat in 2 hours if headache persists or  recurs. 10 tablet 6   No current facility-administered medications for this visit.    Medication Side Effects: None  Allergies:  Allergies  Allergen Reactions   Asa [Aspirin] Other (See Comments)    GERD   Aspartame And Phenylalanine     Joint aches   Chocolate Flavor     Other reaction(s): excessive belching   Codeine Hives   Dextroamphetamine Hives   Diclofenac Sodium Hives    Other reaction(s): hives   Erythromycin Hives    Metformin And Related Hives   Mint Chocolate Chip Flavor    Other     Splenda, Stevia, Truvia   Oxycodone Hives   Oxycodone-Acetaminophen     Other reaction(s): vomiting   Penicillins Hives   Pennsaid [Diclofenac Sodium] Hives   Empagliflozin Nausea And Vomiting    Other reaction(s): diarrhea    Past Medical History:  Diagnosis Date   Allergy    Diabetes mellitus without complication (HCC)    GERD (gastroesophageal reflux disease)    Migraine    Thyroid disease     Family History  Problem Relation Age of Onset   Cancer Brother 45       renal cell carcinoma   Migraines Mother    Seizures Mother    Skin cancer Father    Thyroid disease Sister    Thyroid disease Sister    Colon cancer Neg Hx    Stomach cancer Neg Hx    Rectal cancer Neg Hx    Esophageal cancer Neg Hx    Liver cancer Neg Hx     Social History   Socioeconomic History   Marital status: Married    Spouse name: Not on file   Number of children: 0   Years of education: college   Highest education level: Not on file  Occupational History   Occupation: substitute Magazine features editor: [employer]  Tobacco Use   Smoking status: Never   Smokeless tobacco: Never  Vaping Use   Vaping Use: Never used  Substance and Sexual Activity   Alcohol use: No    Alcohol/week: 0.0 standard drinks of alcohol   Drug use: No   Sexual activity: Yes    Partners: Male    Birth control/protection: Condom  Other Topics Concern   Not on file  Social History Narrative   Lives with her boyfriend.   Right-handed.   2-3 cups caffeine per day.   Social Determinants of Health   Financial Resource Strain: Not on file  Food Insecurity: Not on file  Transportation Needs: Not on file  Physical Activity: Not on file  Stress: Not on file  Social Connections: Not on file  Intimate Partner Violence: Not on file    Past Medical History, Surgical history, Social history, and Family history were reviewed and updated as  appropriate.   Please see review of systems for further details on the patient's review from today.   Objective:   Physical Exam:  There were no vitals taken for this visit.  Physical Exam Constitutional:      General: She is not in acute distress. Musculoskeletal:        General: No deformity.  Neurological:     Mental Status: She is alert and oriented to person, place, and time.     Coordination: Coordination normal.  Psychiatric:        Attention and Perception: Attention and perception normal. She does not perceive auditory or visual hallucinations.  Mood and Affect: Mood normal. Mood is not anxious or depressed. Affect is not labile, blunt, angry or inappropriate.        Speech: Speech normal.        Behavior: Behavior normal.        Thought Content: Thought content normal. Thought content is not paranoid or delusional. Thought content does not include homicidal or suicidal ideation. Thought content does not include homicidal or suicidal plan.        Cognition and Memory: Cognition and memory normal.        Judgment: Judgment normal.     Comments: Insight intact     Lab Review:     Component Value Date/Time   NA 138 11/16/2017 1532   K 3.4 (L) 11/16/2017 1532   CL 100 (L) 11/16/2017 1532   CO2 19 (L) 11/16/2017 1523   GLUCOSE 235 (H) 11/16/2017 1532   BUN 13 11/16/2017 1532   CREATININE 0.50 11/16/2017 1532   CALCIUM 9.2 11/16/2017 1523   PROT 7.4 11/16/2017 1523   ALBUMIN 4.4 11/16/2017 1523   AST 26 11/16/2017 1523   ALT 21 11/16/2017 1523   ALKPHOS 50 11/16/2017 1523   BILITOT 0.5 11/16/2017 1523   GFRNONAA >60 11/16/2017 1523   GFRAA >60 11/16/2017 1523       Component Value Date/Time   WBC 14.6 (H) 11/16/2017 1523   RBC 5.00 11/16/2017 1523   HGB 15.0 11/16/2017 1532   HCT 44.0 11/16/2017 1532   PLT 289 11/16/2017 1523   MCV 85.8 11/16/2017 1523   MCH 29.6 11/16/2017 1523   MCHC 34.5 11/16/2017 1523   RDW 12.0 11/16/2017 1523   LYMPHSABS  1.4 11/16/2017 1523   MONOABS 0.7 11/16/2017 1523   EOSABS 0.0 11/16/2017 1523   BASOSABS 0.0 11/16/2017 1523    No results found for: "POCLITH", "LITHIUM"   No results found for: "PHENYTOIN", "PHENOBARB", "VALPROATE", "CBMZ"   .res Assessment: Plan:    Plan:  PDMP reviewed  Continue Lorazepam 0.5mg  - taking 3 tablets daily for anxiety Continue Lexapro 10mg    RTC 2 months  Patient advised to contact office with any questions, adverse effects, or acute worsening in signs and symptoms.    Time spent with patient was 20 minutes. Greater than 50% of face to face time with patient was spent on counseling and coordination of care.   There are no diagnoses linked to this encounter.   Please see After Visit Summary for patient specific instructions.  Future Appointments  Date Time Provider Department Center  06/18/2022 11:20 AM Valerie Fredin, 08/18/2022, NP [facility] None  06/28/2022 10:00 AM 06/30/2022, LCSW [facility] None  07/12/2022 10:00 AM 09/11/2022, LCSW [facility] None  10/18/2022  8:30 AM Shamleffer, 12/17/2022, MD [id number] None    No orders of the defined types were placed in this encounter.     -------------------------------

## 2022-06-22 DIAGNOSIS — Z1211 Encounter for screening for malignant neoplasm of colon: Secondary | ICD-10-CM | POA: Diagnosis not present

## 2022-06-27 LAB — COLOGUARD: COLOGUARD: NEGATIVE

## 2022-06-27 LAB — EXTERNAL GENERIC LAB PROCEDURE: COLOGUARD: NEGATIVE

## 2022-06-28 ENCOUNTER — Ambulatory Visit (INDEPENDENT_AMBULATORY_CARE_PROVIDER_SITE_OTHER): Payer: BC Managed Care – PPO | Admitting: Psychiatry

## 2022-06-28 DIAGNOSIS — F411 Generalized anxiety disorder: Secondary | ICD-10-CM

## 2022-06-28 NOTE — Progress Notes (Signed)
[facility] Counselor/Therapist Progress Note  Patient ID: Ashlee Dennis, MRN: [id number],    Date: 06/28/2022  Time Spent: 55 minutes   Treatment Type: Individual Therapy  Reported Symptoms: anxiety, sadness/grief re: loss of parents       Mental Status Exam:  Appearance:   Casual and Neat     Behavior:  Appropriate, Sharing, and Motivated  Motor:  Normal  Speech/Language:   Clear and Coherent  Affect:  Anxious, sadness/grief  Mood:  anxious and sad  Thought process:  goal directed  Thought content:    WNL  Sensory/Perceptual disturbances:    WNL  Orientation:  oriented to person, place, time/date, situation, day of week, month of year, year, and stated date of Sept. 18, 2023  Attention:  Good  Concentration:  Good  Memory:  WNL  Fund of knowledge:   Good  Insight:    Good  Judgment:   Good  Impulse Control:  Good   Risk Assessment: Danger to Self:  No Self-injurious Behavior: No Danger to Others: No Duty to Warn:no Physical Aggression / Violence:No  Access to Firearms a concern: No  Gang Involvement:No   Subjective:   Patient in today reporting anxiety and sadness re: prostate cancer, cirrhosis of liver, ALS, and skin cancer. Taking care of her husband and working through the grief of her parent's death is "overwhelming at times."  "I worry about husband, I worry about myself, but am doing some better than I was a few months ago." "None of the physical symptoms I was having". Moved into bigger home that offers them more room, and is patient's home in which she grew up in.  Anniversary dates within family are especially difficult for patient as she processed today. (Parents dies within 11 months of each other in 2021-2022, mom with internal bleeding, and dad with cancer.) Talks of missing her parents so much especially with family functions, which is difficult for her, and needed significant time today in session to focus on some unresolved grief with their deaths, along  with the daily issues she faces with her husband's illness.  Does have some good contacts with friends but does not discuss all the details with them.  Still without her hearing aid batteries and is checking on them today is that we will make a significant difference for her and interacting with others.  No further panic situations since her trip to the beach.  Does feel fortunate that she has supportive relationships within her family and friends.  Continues to work on "taking care of myself" as that "is one of my big challenges".  Interventions: Cognitive Behavioral Therapy and Ego-Supportive  Treatment goals: Treatment goals remain on treatment plan as patient works with strategies to achieve her goals.  Progress is assessed each session and noted in the "subjective" and/or "plan" sections of treatment note. Long-term goal: Reduce overall level, frequency, and intensity of the anxiety so that daily functioning is not impaired. Short-term goal: Describe current and past experiences with specific fears, prominent worries, and anxiety symptoms including their impact on functioning and attempts to resolve it.  Strategies: Identify, challenge, and replace fearful/anxious self-talk with positive, realistic, and empowering self-talk.   Diagnosis:   ICD-10-CM   1. Generalized anxiety disorder  F41.1      Plan:  Patient today showing motivation and active participation in session focusing on unresolved grief regarding the death of both parents within a year's time and also the current status of her husband  who has prostate cancer, cirrhosis of the liver, skin cancer, and ALS per patient report.  Is getting some better about having others involved in sitting with him at times or getting him out so that she can try to have some time of her own.  Trying to do this without excessive guilt.  Showed a lot of strength encouraged today in talking about her grief and also some of her own personal feelings that  she is opening up about.  Is definitely making progress and needs to continue therapy so as to work with goal directed behaviors to seek support and help as needed and keep moving in a forward direction of improved self-care and feeling more hopeful for herself and her ability to manage life situations especially where there is significant uncertainties. Encouraged patient in her practice of more positive behaviors including: Staying in the present focusing on what she can control or change, getting outside some each day and walking, staying in touch with people who are supportive, look for more positives versus negatives each day, healthy nutrition and exercise, not assuming worst case scenarios, positive self talk, reduce overthinking and over analyzing, letting go of things from the past that can hold her back now, and recognize the strength she shows as she works with goal directed behaviors to move in a direction that supports increased confidence and her improved emotional health.  Goal review and progress/challenges noted with patient.  Next appointment within 2-3 weeks.  This record has been created using [employer].  Chart creation errors have been sought, but may not always have been located and corrected.  Such creation errors do not reflect on the standard of medical care provided.   Mathis Fare, LCSW

## 2022-07-01 DIAGNOSIS — G4733 Obstructive sleep apnea (adult) (pediatric): Secondary | ICD-10-CM | POA: Diagnosis not present

## 2022-07-01 DIAGNOSIS — I10 Essential (primary) hypertension: Secondary | ICD-10-CM | POA: Diagnosis not present

## 2022-07-01 DIAGNOSIS — E039 Hypothyroidism, unspecified: Secondary | ICD-10-CM | POA: Diagnosis not present

## 2022-07-12 ENCOUNTER — Ambulatory Visit (INDEPENDENT_AMBULATORY_CARE_PROVIDER_SITE_OTHER): Payer: BC Managed Care – PPO | Admitting: Psychiatry

## 2022-07-12 DIAGNOSIS — F411 Generalized anxiety disorder: Secondary | ICD-10-CM

## 2022-07-12 NOTE — Progress Notes (Signed)
[facility] Counselor/Therapist Progress Note  Patient ID: Ashlee Dennis, MRN: [id number],    Date: 07/12/2022  Time Spent: 45 minutes   Treatment Type: Individual Therapy  Reported Symptoms: anxiety, sadness re: grief issues related to parents  Mental Status Exam:  Appearance:   Neat     Behavior:  Appropriate, Sharing, and Motivated  Motor:  Normal  Speech/Language:   Clear and Coherent  Affect:  Anxiety, some sadness  Mood:  anxious and sad  Thought process:  goal directed  Thought content:    WNL  Sensory/Perceptual disturbances:    WNL  Orientation:  oriented to person, place, time/date, situation, day of week, month of year, year, and stated date of Oct. 2, 2023  Attention:  Good  Concentration:  Good  Memory:  WNL  Fund of knowledge:   Good  Insight:    Good  Judgment:   Good  Impulse Control:  Good   Risk Assessment: Danger to Self:  No Self-injurious Behavior: No Danger to Others: No Duty to Warn:no Physical Aggression / Violence:No  Access to Firearms a concern: No  Gang Involvement:No   Subjective: Patient in today and reporting continued sadness regarding husband's diabetes, prostate cancer, cirrhosis of the liver, ALS, and skin cancer.  Managing care for her husband I will continue to work through the grief of losing both of her parents is challenging and we focused on her struggles in this area more today.  States she worries about her husband and herself but also recognizing some progress. Some sadness more recently related to her grief and also as she and husband's 3rd anniversary ("surprised that with his health problems that husband is still here.") Celebrated 59rd anniversary with husband along with family this weekend. Selling former house and finding it "very emotional" for her and processed feelings in session today. With mom's death, dad's death within 99 month, and her husband's current prostate cancer, cirrhosis of liver, ALS, and skin cancer, and  diabetes which is currently his more critical diagnosis, patient has struggled to cope and work through her grief. Needed session today to talk through her fears, anxieties, and doubts and did well in communicating her concerns. "Sadness is usually more in early morning. Has good supportive relationships, and contacts with neighbors .Recognizing some progress since starting Lorazepam. Hearing issues continues and she states she is to pick up her hearing aids later in the week.  Working with goals and making progress.  Also focusing on self-care which she describes as a challenge for her but seems to have done better more recently.  Interventions: Cognitive Behavioral Therapy and Ego-Supportive  Treatment goals: Treatment goals remain on treatment plan as patient works with strategies to achieve her goals.  Progress is assessed each session and noted in the "subjective" and/or "plan" sections of treatment note. Long-term goal: Reduce overall level, frequency, and intensity of the anxiety so that daily functioning is not impaired. Short-term goal: Describe current and past experiences with specific fears, prominent worries, and anxiety symptoms including their impact on functioning and attempts to resolve it.  Strategies: Identify, challenge, and replace fearful/anxious self-talk with positive, realistic, and empowering self-talk.   Diagnosis:   ICD-10-CM   1. Generalized anxiety disorder  F41.1       Plan: Patient today showing good motivation and participated actively in session working on her anxiety, grief, and some recent increase in sadness related to difficult dates within the family.  Has been prescribed lorazepam which seems to  have helped her also in being calmer and having better rest.  Noticeable issues with her hearing today and states that she is to pick up her new hearing aids later this week.  Is making progress with goal directed behaviors and working through multiple grief/loss  issues as well as current situation with husband's health which impacts patient's emotional stability.  Needs continued therapy as she works on her grief and loss issues related to both past and present and also her anxiety.  Seems more upbeat today and better able to see and experience some progress.  Showing more strength as she works through issues.  Better self-care and seems to trust her strength more and believe in herself as she copes with losses from the past and currently facing husband's decline where there is much uncertainty about his future. Encouraged patient and practicing more positive behaviors as noted in session including: Remaining in the present focusing on what she can control or change, get outside some each day and walk, staying in touch with people who are supportive, looking for more positives versus negatives each day, healthy nutrition and exercise, not assuming worst case scenarios, positive self talk, reduce overthinking and over analyzing, letting go of things from the past that can hold her back now, and recognize the strengths she shows working with goal directed behaviors to move in a direction that supports her improved emotional health, and increased self-confidence.  Goal review and progress/challenges noted with patient.  Next appointment within 2-3 weeks.  This record has been created using [employer].  Chart creation errors have been sought, but may not always have been located and corrected.  Such creation errors do not reflect on the standard of medical care provided.   Mathis Fare, LCSW

## 2022-07-14 DIAGNOSIS — F419 Anxiety disorder, unspecified: Secondary | ICD-10-CM | POA: Diagnosis not present

## 2022-07-14 DIAGNOSIS — E1169 Type 2 diabetes mellitus with other specified complication: Secondary | ICD-10-CM | POA: Diagnosis not present

## 2022-07-14 DIAGNOSIS — R635 Abnormal weight gain: Secondary | ICD-10-CM | POA: Diagnosis not present

## 2022-07-16 DIAGNOSIS — Q8789 Other specified congenital malformation syndromes, not elsewhere classified: Secondary | ICD-10-CM | POA: Diagnosis not present

## 2022-07-16 DIAGNOSIS — H903 Sensorineural hearing loss, bilateral: Secondary | ICD-10-CM | POA: Diagnosis not present

## 2022-07-16 DIAGNOSIS — H6123 Impacted cerumen, bilateral: Secondary | ICD-10-CM | POA: Diagnosis not present

## 2022-07-21 DIAGNOSIS — G4733 Obstructive sleep apnea (adult) (pediatric): Secondary | ICD-10-CM | POA: Diagnosis not present

## 2022-07-26 ENCOUNTER — Ambulatory Visit (INDEPENDENT_AMBULATORY_CARE_PROVIDER_SITE_OTHER): Payer: BC Managed Care – PPO | Admitting: Psychiatry

## 2022-07-26 DIAGNOSIS — F411 Generalized anxiety disorder: Secondary | ICD-10-CM

## 2022-07-26 NOTE — Progress Notes (Signed)
[facility] Counselor/Therapist Progress Note  Patient ID: Lawren Sexson, MRN: [id number],    Date: 07/26/2022  Time Spent: 55 minutes   Treatment Type: Individual Therapy  Reported Symptoms: anxiety (improved)  Mental Status Exam:  Appearance:   Casual     Behavior:  Appropriate, Sharing, and Motivated  Motor:  Normal  Speech/Language:   Clear and Coherent  Affect:  anxious  Mood:  anxious  Thought process:  goal directed  Thought content:    WNL  Sensory/Perceptual disturbances:    WNL  Orientation:  oriented to person, place, time/date, situation, day of week, month of year, year, and stated date of Oct. 16, 2016  Attention:  Good  Concentration:  Good  Memory:  WNL  Fund of knowledge:   Good  Insight:    Good  Judgment:   Good  Impulse Control:  Good   Risk Assessment: Danger to Self:  No Self-injurious Behavior: No Danger to Others: No Duty to Warn:no Physical Aggression / Violence:No  Access to Firearms a concern: No  Gang Involvement:No   Subjective:  Patient in today and reports anxiety as main concern and is noticing progress. Talking things out more and crying less. Less frustrated. "Trusting friends more in talking things through." "Able to laugh at some things which is improvement." Concerned about husband and all his health issues but "trying to make the best of our time and does feel husband does better at times." Was able to be with friends some this weekend "which really helped". Shares how Halloween is very sensitive for her as it was "her mom's favorite holiday prior to her death." Feel like I am doing much better than I was back in June.  Husband seemed some better recently. Patient had been worried previously due to "all his symptoms" and are careful not to let people visit if they seem to have any symptoms of illness. Reports no longer having fears and doubt. Continues today in processing her grief over loss of mom and dad. States her sadness is  typically stronger in the mornings and "is still getting some better" but "I still have may moments because of the multiple stressors within the family. Self-care "was a challenge but doing better now."  Interventions: Cognitive Behavioral Therapy and Ego-Supportive  Treatment goals: Treatment goals remain on treatment plan as patient works with strategies to achieve her goals.  Progress is assessed each session and noted in the "subjective" and/or "plan" sections of treatment note. Long-term goal: Reduce overall level, frequency, and intensity of the anxiety so that daily functioning is not impaired. Short-term goal: Describe current and past experiences with specific fears, prominent worries, and anxiety symptoms including their impact on functioning and attempts to resolve it.  Strategies: Identify, challenge, and replace fearful/anxious self-talk with positive, realistic, and empowering self-talk.   Diagnosis:   ICD-10-CM   1. Generalized anxiety disorder  F41.1      Plan:  Patient today showing active participation and good motivation focusing on anxiety and grief issues (related to death of her mother and father) and as she stated "I am talking things out more and crying less".  Adds that she has been able to talk more with friends than she has been doing in the past regarding her grief and anxiousness.  Does report that both her anxiety and grief are decreasing.  Able to laugh occasionally which she sees as a definite improvement.  Remains very concerned about husband's health issues although feels that  he is "somewhat stable for now".  Noticing some gradual decline in her sadness and that she is not typically as sad in the mornings as she has been previously.  Has worked to improve her self-care especially since last session and noticing improvement in her outlook as she continues working through multiple grief/loss situations.  Continues to show more strength personally and emotionally.   Believing more in herself even in her awareness that her husband is also facing declining health issues where there is lots of uncertainty involved, and she was able to process this more in session today. Encouraged patient in practicing more positive behaviors as discussed in sessions including: Staying in the present focusing on what she can control her change, getting outside some each day and walking, remain in touch with people who are supportive, be looking for more positives versus negatives daily, healthy nutrition and exercise, not assuming worst-case scenarios, positive self talk, reduce her overthinking and over analyzing, letting go of things from the past that can hold her back now, and realize the strength she shows when working with goal-directed behaviors to move in a direction that supports her improved emotional health.  Goal review and progress/challenges noted with patient.  Next appointment within 2 to 3 weeks.  This record has been created using [employer].  Chart creation errors have been sought, but may not always have been located and corrected.  Such creation errors do not reflect on the standard of medical care provided.   Mathis Fare, LCSW

## 2022-07-30 DIAGNOSIS — I10 Essential (primary) hypertension: Secondary | ICD-10-CM | POA: Diagnosis not present

## 2022-07-30 DIAGNOSIS — E039 Hypothyroidism, unspecified: Secondary | ICD-10-CM | POA: Diagnosis not present

## 2022-07-30 DIAGNOSIS — E7800 Pure hypercholesterolemia, unspecified: Secondary | ICD-10-CM | POA: Diagnosis not present

## 2022-07-30 DIAGNOSIS — Z23 Encounter for immunization: Secondary | ICD-10-CM | POA: Diagnosis not present

## 2022-07-30 DIAGNOSIS — E1169 Type 2 diabetes mellitus with other specified complication: Secondary | ICD-10-CM | POA: Diagnosis not present

## 2022-07-30 DIAGNOSIS — F419 Anxiety disorder, unspecified: Secondary | ICD-10-CM | POA: Diagnosis not present

## 2022-07-30 DIAGNOSIS — F322 Major depressive disorder, single episode, severe without psychotic features: Secondary | ICD-10-CM | POA: Diagnosis not present

## 2022-08-23 ENCOUNTER — Encounter: Payer: Self-pay | Admitting: Psychiatry

## 2022-08-23 ENCOUNTER — Encounter: Payer: Self-pay | Admitting: Adult Health

## 2022-08-23 ENCOUNTER — Ambulatory Visit (INDEPENDENT_AMBULATORY_CARE_PROVIDER_SITE_OTHER): Payer: BC Managed Care – PPO | Admitting: Adult Health

## 2022-08-23 ENCOUNTER — Ambulatory Visit (INDEPENDENT_AMBULATORY_CARE_PROVIDER_SITE_OTHER): Payer: BC Managed Care – PPO | Admitting: Psychiatry

## 2022-08-23 DIAGNOSIS — F411 Generalized anxiety disorder: Secondary | ICD-10-CM | POA: Diagnosis not present

## 2022-08-23 MED ORDER — ESCITALOPRAM OXALATE 10 MG PO TABS: 10.0000 mg | ORAL_TABLET | Freq: Every day | ORAL | 2 refills | Status: DC

## 2022-08-23 MED ORDER — LORAZEPAM 0.5 MG PO TABS: ORAL_TABLET | ORAL | 2 refills | Status: DC

## 2022-08-23 NOTE — Progress Notes (Signed)
Ashlee Dennis [id number] 01/13/1971 51 y.o.  Subjective:   Patient ID:  Ashlee Dennis is a 51 y.o. (DOB April 16, 1971) female.  Chief Complaint: No chief complaint on file.   HPI Ashlee Dennis presents to the office today for follow-up of GAD.  Describes mood today as "ok". Pleasant. Decreased tearfulness. Mood symptoms - reports decreased depression, anxiety, and irritability. Reports decreased worry and rumination. Decreased over thinking. Mood has been consistent. Stating "I feel like I'm doing alright". Feels like medications are helpful. Stable interest and motivation. Taking medications as prescribed.  Energy levels improved. Active, has a regular exercise routine. Enjoys some usual interests and activities. Married. Lives with husband of 23 years. Family local. Spending time with family. Appetite adequate. Weight gain - 134 to 145 pounds. Sleeps well most nights. Averages 6 to 7 hours. Has a CPAP, not using it. Focus and concentration stable. 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 Completing tasks. Managing aspects of household. 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  Denies SI or HI.  Denies AH or VH. Denies self harm. Denies substance use.  Previous medication trials:  Lorazepam, Lexapro    PHQ2-9    Flowsheet Row Office Visit from 02/25/2017 in [facility] at [facility] from 05/05/2016 in [facility] at [facility] from 08/19/2015 in Primary Care at [facility] Total Score 0 0 0        Review of Systems:  Review of Systems  Musculoskeletal:  Negative for gait problem.  Neurological:  Negative for tremors.  Psychiatric/Behavioral:         Please refer to HPI    Medications: I have reviewed the patient's current medications.  Current Outpatient Medications  Medication Sig Dispense Refill   escitalopram (LEXAPRO) 10 MG tablet Take 1 tablet (10 mg total) by mouth daily. 30 tablet 2   fexofenadine (ALLEGRA) 180 MG tablet Take 180 mg by  mouth daily.     fluticasone (FLONASE) 50 MCG/ACT nasal spray as needed.  3   glimepiride (AMARYL) 2 MG tablet daily.  12   levothyroxine (SYNTHROID, LEVOTHROID) 75 MCG tablet Take 75 mcg by mouth daily.     LORazepam (ATIVAN) 0.5 MG tablet Take one tablet four times daily for increased anxiety. 120 tablet 2   losartan (COZAAR) 50 MG tablet Take 1 tablet by mouth daily.     montelukast (SINGULAIR) 10 MG tablet Take 10 mg by mouth daily.     pantoprazole (PROTONIX) 40 MG tablet Take 1 tablet by mouth daily.  1   SUMAtriptan (IMITREX) 100 MG tablet Take 1 tablet (100 mg total) by mouth once as needed for up to 1 dose for migraine. May repeat in 2 hours if headache persists or recurs. 10 tablet 6   No current facility-administered medications for this visit.    Medication Side Effects: None  Allergies:  Allergies  Allergen Reactions   Asa [Aspirin] Other (See Comments)    GERD   Aspartame And Phenylalanine     Joint aches   Chocolate Flavor     Other reaction(s): excessive belching   Codeine Hives   Dextroamphetamine Hives   Diclofenac Sodium Hives    Other reaction(s): hives   Erythromycin Hives   Metformin And Related Hives   Mint Chocolate Chip Flavor    Other     Splenda, Stevia, Truvia   Oxycodone Hives   Oxycodone-Acetaminophen     Other reaction(s): vomiting   Penicillins Hives   Pennsaid [Diclofenac  Sodium] Hives   Empagliflozin Nausea And Vomiting    Other reaction(s): diarrhea    Past Medical History:  Diagnosis Date   Allergy    Diabetes mellitus without complication (HCC)    GERD (gastroesophageal reflux disease)    Migraine    Thyroid disease     Past Medical History, Surgical history, Social history, and Family history were reviewed and updated as appropriate.   Please see review of systems for further details on the patient's review from today.   Objective:   Physical Exam:  There were no vitals taken for this visit.  Physical  Exam Constitutional:      General: She is not in acute distress. Musculoskeletal:        General: No deformity.  Neurological:     Mental Status: She is alert and oriented to person, place, and time.     Coordination: Coordination normal.  Psychiatric:        Attention and Perception: Attention and perception normal. She does not perceive auditory or visual hallucinations.        Mood and Affect: Mood normal. Mood is not anxious or depressed. Affect is not labile, blunt, angry or inappropriate.        Speech: Speech normal.        Behavior: Behavior normal.        Thought Content: Thought content normal. Thought content is not paranoid or delusional. Thought content does not include homicidal or suicidal ideation. Thought content does not include homicidal or suicidal plan.        Cognition and Memory: Cognition and memory normal.        Judgment: Judgment normal.     Comments: Insight intact     Lab Review:     Component Value Date/Time   NA 138 11/16/2017 1532   K 3.4 (L) 11/16/2017 1532   CL 100 (L) 11/16/2017 1532   CO2 19 (L) 11/16/2017 1523   GLUCOSE 235 (H) 11/16/2017 1532   BUN 13 11/16/2017 1532   CREATININE 0.50 11/16/2017 1532   CALCIUM 9.2 11/16/2017 1523   PROT 7.4 11/16/2017 1523   ALBUMIN 4.4 11/16/2017 1523   AST 26 11/16/2017 1523   ALT 21 11/16/2017 1523   ALKPHOS 50 11/16/2017 1523   BILITOT 0.5 11/16/2017 1523   GFRNONAA >60 11/16/2017 1523   GFRAA >60 11/16/2017 1523       Component Value Date/Time   WBC 14.6 (H) 11/16/2017 1523   RBC 5.00 11/16/2017 1523   HGB 15.0 11/16/2017 1532   HCT 44.0 11/16/2017 1532   PLT 289 11/16/2017 1523   MCV 85.8 11/16/2017 1523   MCH 29.6 11/16/2017 1523   MCHC 34.5 11/16/2017 1523   RDW 12.0 11/16/2017 1523   LYMPHSABS 1.4 11/16/2017 1523   MONOABS 0.7 11/16/2017 1523   EOSABS 0.0 11/16/2017 1523   BASOSABS 0.0 11/16/2017 1523    No results found for: "POCLITH", "LITHIUM"   No results found for:  "PHENYTOIN", "PHENOBARB", "VALPROATE", "CBMZ"   .res Assessment: Plan:    Plan:  PDMP reviewed  Lorazepam 0.5mg  - taking 3 tablets daily for anxiety - has a 4th dose  as needed. Lexapro 10mg    RTC 6 weeks   Patient advised to contact office with any questions, adverse effects, or acute worsening in signs and symptoms.    Time spent with patient was 20 minutes. Greater than 50% of face to face time with patient was spent on counseling and coordination of care.  Diagnoses and all orders for this visit:  Generalized anxiety disorder -     LORazepam (ATIVAN) 0.5 MG tablet; Take one tablet four times daily for increased anxiety. -     escitalopram (LEXAPRO) 10 MG tablet; Take 1 tablet (10 mg total) by mouth daily.     Please see After Visit Summary for patient specific instructions.  Future Appointments  Date Time Provider Department Center  08/23/2022 12:00 PM Mathis Fare, LCSW [facility] None  10/18/2022  8:30 AM Shamleffer, Konrad Dolores, MD [id number] None    No orders of the defined types were placed in this encounter.   -------------------------------

## 2022-08-23 NOTE — Progress Notes (Signed)
[facility] Counselor/Therapist Progress Note  Patient ID: Ashlee Dennis, MRN: [id number],    Date: 08/23/2022  Time Spent: 50 minutes   Treatment Type: Individual Therapy  Reported Symptoms: anxiety, some depression  Mental Status Exam:  Appearance:   Casual     Behavior:  Appropriate, Sharing, and Motivated  Motor:  Normal  Speech/Language:   Clear and Coherent  Affect:  anxious  Mood:  anxious  Thought process:  goal directed  Thought content:    WNL and Rumination  Sensory/Perceptual disturbances:    WNL  Orientation:  oriented to person, place, time/date, situation, day of week, month of year, year, and stated date of Nov. 13, 2023  Attention:  Good  Concentration:  Good  Memory:  WNL  Fund of knowledge:   Good  Insight:    Good  Judgment:   Good  Impulse Control:  Good   Risk Assessment: Danger to Self:  No Self-injurious Behavior: No Danger to Others: No Duty to Warn:no Physical Aggression / Violence:No  Access to Firearms a concern: No  Gang Involvement:No   Subjective:  Patient in today reporting anxiety is her main symptom and depression is decreasing. Self-care improving with more showers, washing hair more regularly, trying to exercise more regularly, having 6 hours a day to herself while sitter is with husband. Continues work on some unresolved grief re: prior family deaths. Less tearfulness. Shows good sense of humor. Expresses concerns about husband and his physical issues but doctors did rule out an added diagnosis of ALS, but does continue live with diabetes and cancer. Let's friends be helpful to her. Is getting together closer to Thanksgiving with some of her friends.  Interventions: Cognitive Behavioral Therapy and Ego-Supportive  Long-term goal: Reduce overall level, frequency, and intensity of the anxiety so that daily functioning is not impaired. Short-term goal: Describe current and past experiences with specific fears, prominent worries, and  anxiety symptoms including their impact on functioning and attempts to resolve it.  Strategies: Identify, challenge, and replace fearful/anxious self-talk with positive, realistic, and empowering self-talk.    Diagnosis:   ICD-10-CM   1. Generalized anxiety disorder  F41.1      Plan:  Patient actively participating in showing good motivation in session today as she worked further on her anxiety and grief issues. Continued work with some unresolved grief re: mother and dad "but I am doing some better with it "as I don't cry as often about them."  Adds she "talks about them more and cries less." Feels contact with friends helps and she is trying to be in more regular contact. Able to laugh more. Gradual decline in sadness. Concerned about husband's serious health issues but seems a little more stabilized currently.  Reinforced the progress she has made and working through and continuing to deal with some unresolved grief regarding her parents and anxiousness which is also improving.  Has good supportive friends for which she is grateful.  As noted above she has definitely made some gains and personal self-care.  Having more times of enjoying laughter using with husband or friends.  Continued decrease in and previously experienced morning sadness.  Working with goal-directed behaviors to build upon and strengthen the progress she has made and is definitely believing more in herself and her ability to manage stressful situations although at times feels uncertainty about that but is recognizing progress per her report today. Encouraged patient in her practice of more positive behaviors as discussed in session including:  Remaining in the present focusing on what she can control or change, getting outside some each day and walking, staying in touch with people who are supportive of her, look for more positives versus negatives daily, healthy nutrition and exercise, not assuming worst-case scenarios, positive self  talk, reduce her overthinking and overanalyzing, letting go of things from the past that can hold her back now, and recognize the strength she shows working with goal-directed behaviors to move in a direction that supports her improved emotional health and overall wellbeing.  Goal review and progress/challenges noted with patient.  Next appointment within 2 to 3 weeks.  This record has been created using [employer].  Chart creation errors have been sought, but may not always have been located and corrected.  Such creation errors do not reflect on the standard of medical care provided.   Mathis Fare, LCSW

## 2022-08-25 DIAGNOSIS — F419 Anxiety disorder, unspecified: Secondary | ICD-10-CM | POA: Diagnosis not present

## 2022-08-25 DIAGNOSIS — R635 Abnormal weight gain: Secondary | ICD-10-CM | POA: Diagnosis not present

## 2022-08-25 DIAGNOSIS — E1169 Type 2 diabetes mellitus with other specified complication: Secondary | ICD-10-CM | POA: Diagnosis not present

## 2022-09-07 DIAGNOSIS — H6123 Impacted cerumen, bilateral: Secondary | ICD-10-CM | POA: Diagnosis not present

## 2022-09-27 ENCOUNTER — Ambulatory Visit (INDEPENDENT_AMBULATORY_CARE_PROVIDER_SITE_OTHER): Payer: BC Managed Care – PPO | Admitting: Psychiatry

## 2022-09-27 DIAGNOSIS — F411 Generalized anxiety disorder: Secondary | ICD-10-CM | POA: Diagnosis not present

## 2022-09-27 NOTE — Progress Notes (Addendum)
[facility] Counselor/Therapist Progress Note  Patient ID: Ashlee Dennis, MRN: [id number],    Date: 09/27/2022  Time Spent: 48 minutes  Virtual Visit via Telehealth Note: MyChart Video session Connected with patient by a telemedicine/telehealth application, with their informed consent, and verified patient privacy and that I am speaking with the correct person using two identifiers. I discussed the limitations, risks, security and privacy concerns of performing psychotherapy and the availability of in person appointments. I also discussed with the patient that there may be a patient responsible charge related to this service. The patient expressed understanding and agreed to proceed. I discussed the treatment planning with the patient. The patient was provided an opportunity to ask questions and all were answered. The patient agreed with the plan and demonstrated an understanding of the instructions. The patient was advised to call  our office if  symptoms worsen or feel they are in a crisis state and need immediate contact.   Therapist Location: office Patient Location: home  Treatment Type: Individual Therapy  Reported Symptoms: anxiety, worrying/concern about friend rushed to hospital yesterday and in ICU  Mental Status Exam:  Appearance:   Neat     Behavior:  Appropriate, Sharing, and Motivated  Motor:  Normal  Speech/Language:   Clear and Coherent  Affect:  anxious  Mood:  anxious  Thought process:  goal directed  Thought content:    WNL  Sensory/Perceptual disturbances:    WNL  Orientation:  oriented to person, place, time/date, situation, day of week, month of year, year, and stated date of Dec. 18, 2023  Attention:  Good  Concentration:  Good  Memory:  WNL  Fund of knowledge:   Good  Insight:    Good and Fair  Judgment:   Good  Impulse Control:  Good   Risk Assessment: Danger to Self:  No Self-injurious Behavior: No Danger to Others: No Duty to Warn:no Physical  Aggression / Violence:No  Access to Firearms a concern: No  Gang Involvement:No   Subjective: Patient today reports feeling much better as she's finishing helping a friend through hip replacement surgery.  Had to change to telehealth today due to emergency situation with husband's sitter who's wife was taken to hospital on emergency and in ICU. Patient states her birthday is [birth year] and it's another birthday without my mom here, and shared/processed her sadness. Also very understandably focusing on husband's sitter's sudden illness of wife as it sounds that her situation was very sudden and critical. Patient and husband have known the family for several yrs and was close. Patient discussed her concerns about this and how it has been such a shock for them. Did well today talking through her fears/concerns and trying to be more grounded for now until they can learn further information.  Also processing further her concerns about her husband's illnesses and her fears for him in next few years as he is a good bit older than patient.  States that it helps to be able to talk through all of her concerns and worries and also some positives, with so many things feeling out of their control.  Is glad that doctors ruled out the diagnosis of ALS but husband's diabetes and cancer continues.  Is gaining some support through her friendships over the years.  Interventions: Cognitive Behavioral Therapy, Solution-Oriented/Positive Psychology, and Ego-Supportive  Long-term goal: Reduce overall level, frequency, and intensity of the anxiety so that daily functioning is not impaired. Short-term goal: Describe current and past experiences  with specific fears, prominent worries, and anxiety symptoms including their impact on functioning and attempts to resolve it.  Strategies: Identify, challenge, and replace fearful/anxious self-talk with positive, realistic, and empowering self-talk  Diagnosis:   ICD-10-CM   1.  Generalized anxiety disorder  F41.1      Plan: Patient participating well in session today and as noted above was very concerned about an emergency situation with the wife of her husband sitter who was taken on emergency to the hospital yesterday and is an ICU.  Patient's birthday is coming up in a few more days and she discussed some in session today about how that is always a difficult time for her with her mom not being here as they were very close, and was able to share some of her sadness/grief today and also speak about some of the positives that she had appreciated so much about her mother.  Still showing good motivation.  Reports that she had been "crying less" but recently due to this emergency situation and friend being in ICU, she has found that she has been more tearful understandably.  Good support through multiple friendships and her marital relationship sounds good and stable as well although they are concerned about husband's physical illnesses now and into the future.  Continues to work with goal-directed behaviors, able to recognize some of her progress and ways she wants to continue working on her goals into the future.  Seems to maintain a sense of humor even in the midst of heightened stressors. Encouraged patien in practicing more positive behaviors as noted in session including: Stay in the present and focused on what she can control or change, getting outside and walking some each day, staying in touch with people who are supportive of her, looking for more positives versus negatives, healthy nutrition and exercise, not assuming worst-case scenarios, positive self talk, reduce her overthinking and over analyzing, letting go of things from the past that can hold her back now, and realize the strength she shows working with goal-directed behaviors to move in a direction that supports her improved emotional health.  Goal review and progress/challenges noted with patient.  Next appointment  within 3 to 4 weeks.   Mathis Fare, LCSW

## 2022-10-07 DIAGNOSIS — Z1231 Encounter for screening mammogram for malignant neoplasm of breast: Secondary | ICD-10-CM | POA: Diagnosis not present

## 2022-10-12 DIAGNOSIS — I10 Essential (primary) hypertension: Secondary | ICD-10-CM | POA: Diagnosis not present

## 2022-10-12 DIAGNOSIS — E039 Hypothyroidism, unspecified: Secondary | ICD-10-CM | POA: Diagnosis not present

## 2022-10-12 DIAGNOSIS — G4733 Obstructive sleep apnea (adult) (pediatric): Secondary | ICD-10-CM | POA: Diagnosis not present

## 2022-10-15 NOTE — Progress Notes (Unsigned)
Name: Ashlee Dennis  MRN/ DOB: [id number], 1971-08-09   Age/ Sex: 52 y.o., female    PCP: Renford Dills, MD   Reason for Endocrinology Evaluation: Type 2 Diabetes Mellitus     Date of Initial Endocrinology Visit: 10/18/2022     PATIENT IDENTIFIER: Ashlee Dennis is a 52 y.o. female with a past medical history of DM, HTN, migraine, generalized anxiety disorder, hypothyroid and OSA. The patient presented for initial endocrinology clinic visit on 10/18/2022 for consultative assistance with her diabetes management.    HPI: Ashlee Dennis was    Diagnosed with DM  at age 62 Prior Medications tried/Intolerance: jardiance and metformin - hives , rybelsus severe vomiting  Currently checking blood sugars multiple  x / day,  through freestyle libre Hypoglycemia episodes : yes               Symptoms: yes                  Hemoglobin A1c has ranged from 6.2% in 2012, peaking at 7.0% in 2024.   In terms of diet, the patient eats 3 meals a day. Avoids sugar sweetened beverages   Denies constipation or diarrhea  Has chronic stable local neck swelling  Denies palpitations  Denies tremors  NO biotin     THYROID HISTORY: Patient has been on chronic LT-for replacement.  She has had chronic thyromegaly with an ultrasound in 2014 not revealing any thyroid nodules.  Mother and two sister with thyroid disease     HOME DIABETES REGIMEN: Glimepiride 2 mg daily Januvia 100 mg daily  Synthroid  75 mcg daily     Statin: No ACE-I/ARB: Yes  CONTINUOUS GLUCOSE MONITORING RECORD INTERPRETATION    Dates of Recording: 10/05/2022-10/18/2022  Sensor description:freestyle libre   Results statistics:   CGM use % of time 96  Average and SD 167/24.8  Time in range     69   %  % Time Above 180 27  % Time above 250 4  % Time Below target 0   Glycemic patterns summary: BG's optimal at night , high during the day   Hyperglycemic episodes  postprandial  n/a  Hypoglycemic episodes occurred  Overnight  periods: at goal    DIABETIC COMPLICATIONS: Microvascular complications:   Denies: CKD Last eye exam: Completed 01/08/2022  Macrovascular complications:   Denies: CAD, PVD, CVA   PAST HISTORY: Past Medical History:  Past Medical History:  Diagnosis Date   Allergy    Diabetes mellitus without complication (HCC)    GERD (gastroesophageal reflux disease)    Migraine    Thyroid disease    Past Surgical History:  Past Surgical History:  Procedure Laterality Date   ABDOMINAL HYSTERECTOMY     BREAST SURGERY     Cancer   KNEE ARTHROSCOPY     Teeth Implants     x 2    Social History:  reports that she has never smoked. She has never used smokeless tobacco. She reports that she does not drink alcohol and does not use drugs. Family History:  Family History  Problem Relation Age of Onset   Cancer Brother 40       renal cell carcinoma   Migraines Mother    Seizures Mother    Skin cancer Father    Thyroid disease Sister    Thyroid disease Sister    Colon cancer Neg Hx    Stomach cancer Neg Hx    Rectal cancer Neg Hx  Esophageal cancer Neg Hx    Liver cancer Neg Hx      HOME MEDICATIONS: Allergies as of 10/18/2022       Reactions   Asa [aspirin] Other (See Comments)   GERD   Aspartame And Phenylalanine    Joint aches   Chocolate Flavor    Other reaction(s): excessive belching   Codeine Hives   Dextroamphetamine Hives   Diclofenac Sodium Hives   Other reaction(s): hives   Erythromycin Hives   Metformin And Related Hives   Mint Chocolate Chip Flavor    Other    Splenda, Stevia, Truvia   Oxycodone Hives   Oxycodone-acetaminophen    Other reaction(s): vomiting   Penicillins Hives   Pennsaid [diclofenac Sodium] Hives   Empagliflozin Nausea And Vomiting   Other reaction(s): diarrhea        Medication List        Accurate as of October 18, 2022  8:29 AM. If you have any questions, ask your nurse or doctor.          STOP taking these medications     Jardiance 10 MG Tabs tablet Generic drug: empagliflozin Stopped by: Dorita Sciara, MD       TAKE these medications    escitalopram 10 MG tablet Commonly known as: LEXAPRO Take 1 tablet (10 mg total) by mouth daily.   fexofenadine 180 MG tablet Commonly known as: ALLEGRA Take 180 mg by mouth daily.   fluticasone 50 MCG/ACT nasal spray Commonly known as: FLONASE as needed.   glimepiride 2 MG tablet Commonly known as: AMARYL daily.   levothyroxine 75 MCG tablet Commonly known as: SYNTHROID Take 75 mcg by mouth daily.   LORazepam 0.5 MG tablet Commonly known as: ATIVAN Take one tablet four times daily for increased anxiety.   losartan 50 MG tablet Commonly known as: COZAAR Take 1 tablet by mouth daily.   montelukast 10 MG tablet Commonly known as: SINGULAIR Take 10 mg by mouth daily.   pantoprazole 40 MG tablet Commonly known as: PROTONIX Take 1 tablet by mouth daily.   SUMAtriptan 100 MG tablet Commonly known as: IMITREX Take 1 tablet (100 mg total) by mouth once as needed for up to 1 dose for migraine. May repeat in 2 hours if headache persists or recurs.         ALLERGIES: Allergies  Allergen Reactions   Asa [Aspirin] Other (See Comments)    GERD   Aspartame And Phenylalanine     Joint aches   Chocolate Flavor     Other reaction(s): excessive belching   Codeine Hives   Dextroamphetamine Hives   Diclofenac Sodium Hives    Other reaction(s): hives   Erythromycin Hives   Metformin And Related Hives   Mint Chocolate Chip Flavor    Other     Splenda, Stevia, Truvia   Oxycodone Hives   Oxycodone-Acetaminophen     Other reaction(s): vomiting   Penicillins Hives   Pennsaid [Diclofenac Sodium] Hives   Empagliflozin Nausea And Vomiting    Other reaction(s): diarrhea     REVIEW OF SYSTEMS: A comprehensive ROS was conducted with the patient and is negative except as per HPI     OBJECTIVE:   VITAL SIGNS: BP 134/80 (BP Location: Left  Arm, Patient Position: Sitting, Cuff Size: Small)   Pulse 96   Ht 5\' 4"  (1.626 m)   Wt 154 lb 9.6 oz (70.1 kg)   SpO2 99%   BMI 26.54 kg/m    PHYSICAL  EXAM:  General: Pt appears well and is in NAD  Neck: General: Supple without adenopathy or carotid bruits. Thyroid: Thyroid size normal.  No goiter or nodules appreciated.   Lungs: Clear with good BS bilat with no rales, rhonchi, or wheezes  Heart: RRR   Extremities:  Lower extremities - No pretibial edema.  Neuro: MS is good with appropriate affect, pt is alert and Ox3    DM foot exam: 10/18/2022  The skin of the feet is intact without sores or ulcerations. The pedal pulses are 2+ on right and 2+ on left. The sensation is intact to a screening 5.07, 10 gram monofilament bilaterally   DATA REVIEWED:  07/30/2022 BUN/Cr 17/0.620 GFR 108 LDL 63 Ma/Cr 1.230 Tg 182  TSH 3.9 A1c 7.0%   ASSESSMENT / PLAN / RECOMMENDATIONS:   1) Type 2 Diabetes Mellitus, optimally controlled, Without complications - Most recent A1c of 7.0 %. Goal A1c < 7.0 %.    -A1c at goal -Patient with reported intolerance to metformin, Jardiance, and Rybelsus -We discussed the importance of reducing CHO intake -No changes at this time -Discussed importance of taking glimepiride 15-20 minutes before the first meal of the day  MEDICATIONS: Continue glimepiride 2 mg daily before breakfast Continue Januvia 100 mg daily  EDUCATION / INSTRUCTIONS BG monitoring instructions: Patient is instructed to check her blood sugars 3 times a day before meals Call [facility] if: BG persistently < 70  I reviewed the Rule of 15 for the treatment of hypoglycemia in detail with the patient. Literature supplied.   2) Diabetic complications:  Eye: Does not have known diabetic retinopathy.  Neuro/ Feet: Does not have known diabetic peripheral neuropathy. Renal: Patient does not have known baseline CKD. She is  on an ACEI/ARB at present.  3)  Hypothyroidism:  -Patient is clinically euthyroid -Previous thyroid ultrasound did not reveal any thyroid nodules   Medication Synthroid 75 mcg daily  Follow-up in 4 months    Signed electronically by: Mack Guise, MD  [facility]  [facility] [address] [place], [place] [address] Phone: [phone] FAX: [phone]   CC: [address] [place] [address] Phone: [phone]  Fax: [phone]    Return to Endocrinology clinic as below: Future Appointments  Date Time Provider [place]  10/18/2022  8:30 AM Valeri Sula, Melanie Crazier, MD [facility] None  11/23/2022 10:20 AM [place], Berdie Ogren, NP [facility] None

## 2022-10-18 ENCOUNTER — Encounter: Payer: Self-pay | Admitting: Internal Medicine

## 2022-10-18 ENCOUNTER — Ambulatory Visit: Payer: BC Managed Care – PPO | Admitting: Internal Medicine

## 2022-10-18 VITALS — BP 134/80 | HR 96 | Ht 64.0 in | Wt 154.6 lb

## 2022-10-18 DIAGNOSIS — E039 Hypothyroidism, unspecified: Secondary | ICD-10-CM | POA: Diagnosis not present

## 2022-10-18 DIAGNOSIS — E119 Type 2 diabetes mellitus without complications: Secondary | ICD-10-CM

## 2022-10-18 LAB — BASIC METABOLIC PANEL
BUN: 16 mg/dL (ref 6–23)
CO2: 30 mEq/L (ref 19–32)
Calcium: 9.3 mg/dL (ref 8.4–10.5)
Chloride: 102 mEq/L (ref 96–112)
Creatinine, Ser: 0.66 mg/dL (ref 0.40–1.20)
GFR: 101.85 mL/min (ref >60.00)
Glucose, Bld: 235 mg/dL — ABNORMAL HIGH (ref 70–99)
Potassium: 3.8 mEq/L (ref 3.5–5.1)
Sodium: 139 mEq/L (ref 135–145)

## 2022-10-18 LAB — TSH: TSH: 0.48 u[IU]/mL (ref 0.35–5.50)

## 2022-10-18 LAB — POCT GLYCOSYLATED HEMOGLOBIN (HGB A1C): Hemoglobin A1C: 7 % — AB (ref 4.0–5.6)

## 2022-10-18 MED ORDER — SITAGLIPTIN PHOSPHATE 100 MG PO TABS: 100.0000 mg | ORAL_TABLET | Freq: Every day | ORAL | 3 refills | Status: DC

## 2022-10-18 MED ORDER — GLIMEPIRIDE 2 MG PO TABS: 2.0000 mg | ORAL_TABLET | Freq: Every day | ORAL | 3 refills | Status: DC

## 2022-10-18 NOTE — Patient Instructions (Addendum)
Take Glimepiride 2 mg , 1 tablet before Breakfast  Take Januvia 100 mg, 1 tablet before Breakfast      HOW TO TREAT LOW BLOOD SUGARS (Blood sugar LESS THAN 70 MG/DL) Please follow the RULE OF 15 for the treatment of hypoglycemia treatment (when your (blood sugars are less than 70 mg/dL)   STEP 1: Take 15 grams of carbohydrates when your blood sugar is low, which includes:  3-4 GLUCOSE TABS  OR 3-4 OZ OF JUICE OR REGULAR SODA OR ONE TUBE OF GLUCOSE GEL    STEP 2: RECHECK blood sugar in 15 MINUTES STEP 3: If your blood sugar is still low at the 15 minute recheck --> then, go back to STEP 1 and treat AGAIN with another 15 grams of carbohydrates.

## 2022-10-19 MED ORDER — SYNTHROID 75 MCG PO TABS: 75.0000 ug | ORAL_TABLET | Freq: Every day | ORAL | 3 refills | Status: DC

## 2022-10-22 ENCOUNTER — Telehealth: Payer: Self-pay

## 2022-10-22 NOTE — Telephone Encounter (Signed)
Patient given lab results and verbalized understanding  

## 2022-10-26 DIAGNOSIS — F419 Anxiety disorder, unspecified: Secondary | ICD-10-CM | POA: Diagnosis not present

## 2022-10-26 DIAGNOSIS — R635 Abnormal weight gain: Secondary | ICD-10-CM | POA: Diagnosis not present

## 2022-10-26 DIAGNOSIS — E1169 Type 2 diabetes mellitus with other specified complication: Secondary | ICD-10-CM | POA: Diagnosis not present

## 2022-11-03 ENCOUNTER — Ambulatory Visit (INDEPENDENT_AMBULATORY_CARE_PROVIDER_SITE_OTHER): Payer: BC Managed Care – PPO | Admitting: Psychiatry

## 2022-11-03 DIAGNOSIS — F411 Generalized anxiety disorder: Secondary | ICD-10-CM

## 2022-11-03 NOTE — Progress Notes (Signed)
[facility] Counselor/Therapist Progress Note  Patient ID: Ashlee Dennis, MRN: [id number],    Date: 11/03/2022  Time Spent:  50 minutes  Treatment Type: Individual Therapy  Reported Symptoms: anxiety  Mental Status Exam:  Appearance:   Casual     Behavior:  Appropriate, Sharing, and Motivated  Motor:  Normal  Speech/Language:   Clear and Coherent  Affect:  anxious  Mood:  anxious  Thought process:  goal directed  Thought content:    Rumination  Sensory/Perceptual disturbances:    WNL  Orientation:  oriented to person, place, time/date, situation, day of week, month of year, year, and stated date of Jan. 24, 2024  Attention:  Good  Concentration:  Good  Memory:  WNL  Fund of knowledge:   Good  Insight:    Good  Judgment:   Good  Impulse Control:  Good   Risk Assessment: Danger to Self:  No Self-injurious Behavior: No Danger to Others: No Duty to Warn:no Physical Aggression / Violence:No  Access to Firearms a concern: No  Gang Involvement:No   Subjective:   Patient today reporting anxiety mostly related to personal and family. Some sadness and some anger. (Not all details included in this note due to patient privacy need.) Sometimes I get jealous and end up feeling bad/guilty about what I feel, but my life is so different from my friends as "they can go and come and I can't due to my husband's health I can't leave him." Processed this and more of her difficulties in coping with husband's illness (skin cancer and had surgery, prostate cancer and had tx, and is diabetic. Frustrated /anxious that her house is not selling despite dropping the price. Got hearing aids and seem to be helping. Not as "down nor as worried as I was previously." "People tell me I seem some better and that feels good." "Trying to adjust to my cpap machine, and have not been able to sleep with it a whole night yet."   Interventions: Cognitive Behavioral Therapy and Ego-Supportive  Long-term  goal: Reduce overall level, frequency, and intensity of the anxiety so that daily functioning is not impaired. Short-term goal: Describe current and past experiences with specific fears, prominent worries, and anxiety symptoms including their impact on functioning and attempts to resolve it.  Strategies: Identify, challenge, and replace fearful/anxious self-talk with positive, realistic, and empowering self-talk  Diagnosis:   ICD-10-CM   1. Generalized anxiety disorder  F41.1      Plan:   Patient today motivated and actively participating in session to and reports to be "not quite as worried nor as down".  Laughs appropriately at times today and does seem more relaxed.  Continue to work on some anxiety, sadness and anger as noted above.  Is making progress and needs to continue with goal-directed behaviors to further advance and managing her anxiety and challenges that come up especially with her husband's health and in her own life. Encouraged patient in her practice of more positive behaviors as discussed in session including: Remain in the present and focusing on what she can change her control, getting outside and walking some each day, remaining in touch with people who are supportive of her, look for more positives versus negatives each day including within herself, healthy nutrition and exercise, not assuming worst-case scenarios, reduce her overthinking and over analyzing, positive self talk, letting go of things from the past that can hold her back now, and recognize the strength she shows working with  goal-directed behaviors to move in a direction that supports her improved emotional health.  Goal review and progress/challenges noted with patient.  Next appointment within 3 weeks.  This record has been created using [employer].  Chart creation errors have been sought, but may not always have been located and corrected.  Such creation errors do not reflect on the standard of medical  care provided.   Shanon Ace, LCSW

## 2022-11-08 DIAGNOSIS — Z974 Presence of external hearing-aid: Secondary | ICD-10-CM | POA: Diagnosis not present

## 2022-11-08 DIAGNOSIS — H903 Sensorineural hearing loss, bilateral: Secondary | ICD-10-CM | POA: Diagnosis not present

## 2022-11-23 ENCOUNTER — Encounter: Payer: Self-pay | Admitting: Adult Health

## 2022-11-23 ENCOUNTER — Ambulatory Visit (INDEPENDENT_AMBULATORY_CARE_PROVIDER_SITE_OTHER): Payer: BC Managed Care – PPO | Admitting: Adult Health

## 2022-11-23 DIAGNOSIS — F411 Generalized anxiety disorder: Secondary | ICD-10-CM

## 2022-11-23 MED ORDER — ESCITALOPRAM OXALATE 10 MG PO TABS: 10.0000 mg | ORAL_TABLET | Freq: Every day | ORAL | 2 refills | Status: DC

## 2022-11-23 MED ORDER — LORAZEPAM 0.5 MG PO TABS: ORAL_TABLET | ORAL | 2 refills | Status: DC

## 2022-11-23 NOTE — Progress Notes (Signed)
Parnika Segoviano [id number] 01-17-1971 52 y.o.  Subjective:   Patient ID:  Ashlee Dennis is a 52 y.o. (DOB 26-May-1971) female.  Chief Complaint: No chief complaint on file.   HPI Corin Oppliger presents to the office today for follow-up of GAD.  Describes mood today as "ok". Pleasant. Decreased tearfulness. Mood symptoms - reports decreased depression, anxiety, and irritability - "a little bit". Reports decreased worry, rumination, and over thinking - "no more than usual". Mood has been consistent. Stating "I feel like I'm doing pretty good". Feels like medications are helpful. Stable interest and motivation. Taking medications as prescribed.  Energy levels improved. Active, has a regular exercise routine. Enjoys some usual interests and activities. Married. Lives with husband of 23 years. Family local. Spending time with family. Appetite adequate. Weight gain - 149 pounds. Sleeps well most nights. Averages 6 to 7 hours. Using CPAP nightly. Focus and concentration stable. 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 Completing tasks. Managing aspects of household. 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  Denies SI or HI.  Denies AH or VH. Denies self harm. Denies substance use.  Previous medication trials:  Lorazepam, Lexapro     PHQ2-9    Flowsheet Row Office Visit from 02/25/2017 in Primary Care at [place] from 05/05/2016 in Primary Care at [place] from 08/19/2015 in Primary Care at [facility] Total Score 0 0 0        Review of Systems:  Review of Systems  Musculoskeletal:  Negative for gait problem.  Neurological:  Negative for tremors.  Psychiatric/Behavioral:         Please refer to HPI    Medications: I have reviewed the patient's current medications.  Current Outpatient Medications  Medication Sig Dispense Refill   escitalopram (LEXAPRO) 10 MG tablet Take 1 tablet (10 mg total) by mouth daily. 30 tablet 2   fexofenadine (ALLEGRA) 180 MG  tablet Take 180 mg by mouth daily.     fluticasone (FLONASE) 50 MCG/ACT nasal spray as needed.  3   glimepiride (AMARYL) 2 MG tablet Take 1 tablet (2 mg total) by mouth daily with breakfast. 90 tablet 3   LORazepam (ATIVAN) 0.5 MG tablet Take one tablet four times daily for increased anxiety. 120 tablet 2   losartan (COZAAR) 50 MG tablet Take 1 tablet by mouth daily.     montelukast (SINGULAIR) 10 MG tablet Take 10 mg by mouth daily.     pantoprazole (PROTONIX) 40 MG tablet Take 1 tablet by mouth daily.  1   sitaGLIPtin (JANUVIA) 100 MG tablet Take 1 tablet (100 mg total) by mouth daily. 90 tablet 3   SUMAtriptan (IMITREX) 100 MG tablet Take 1 tablet (100 mg total) by mouth once as needed for up to 1 dose for migraine. May repeat in 2 hours if headache persists or recurs. 10 tablet 6   SYNTHROID 75 MCG tablet Take 1 tablet (75 mcg total) by mouth daily before breakfast. 90 tablet 3   No current facility-administered medications for this visit.    Medication Side Effects: None  Allergies:  Allergies  Allergen Reactions   Asa [Aspirin] Other (See Comments)    GERD   Aspartame And Phenylalanine     Joint aches   Chocolate Flavor     Other reaction(s): excessive belching   Codeine Hives   Dextroamphetamine Hives   Diclofenac Sodium Hives    Other reaction(s): hives   Erythromycin Hives   Metformin And Related Hives  Mint Chocolate Chip Flavor    Other     Splenda, Stevia, Truvia   Oxycodone Hives   Oxycodone-Acetaminophen     Other reaction(s): vomiting   Penicillins Hives   Pennsaid [Diclofenac Sodium] Hives   Semaglutide Nausea And Vomiting   Empagliflozin Nausea And Vomiting    Other reaction(s): diarrhea    Past Medical History:  Diagnosis Date   Allergy    Diabetes mellitus without complication (HCC)    GERD (gastroesophageal reflux disease)    Migraine    Thyroid disease     Past Medical History, Surgical history, Social history, and Family history were  reviewed and updated as appropriate.   Please see review of systems for further details on the patient's review from today.   Objective:   Physical Exam:  There were no vitals taken for this visit.  Physical Exam Constitutional:      General: She is not in acute distress. Musculoskeletal:        General: No deformity.  Neurological:     Mental Status: She is alert and oriented to person, place, and time.     Coordination: Coordination normal.  Psychiatric:        Attention and Perception: Attention and perception normal. She does not perceive auditory or visual hallucinations.        Mood and Affect: Mood normal. Mood is not anxious or depressed. Affect is not labile, blunt, angry or inappropriate.        Speech: Speech normal.        Behavior: Behavior normal.        Thought Content: Thought content normal. Thought content is not paranoid or delusional. Thought content does not include homicidal or suicidal ideation. Thought content does not include homicidal or suicidal plan.        Cognition and Memory: Cognition and memory normal.        Judgment: Judgment normal.     Comments: Insight intact     Lab Review:     Component Value Date/Time   NA 139 10/18/2022 0851   K 3.8 10/18/2022 0851   CL 102 10/18/2022 0851   CO2 30 10/18/2022 0851   GLUCOSE 235 (H) 10/18/2022 0851   BUN 16 10/18/2022 0851   CREATININE 0.66 10/18/2022 0851   CALCIUM 9.3 10/18/2022 0851   PROT 7.4 11/16/2017 1523   ALBUMIN 4.4 11/16/2017 1523   AST 26 11/16/2017 1523   ALT 21 11/16/2017 1523   ALKPHOS 50 11/16/2017 1523   BILITOT 0.5 11/16/2017 1523   GFRNONAA >60 11/16/2017 1523   GFRAA >60 11/16/2017 1523       Component Value Date/Time   WBC 14.6 (H) 11/16/2017 1523   RBC 5.00 11/16/2017 1523   HGB 15.0 11/16/2017 1532   HCT 44.0 11/16/2017 1532   PLT 289 11/16/2017 1523   MCV 85.8 11/16/2017 1523   MCH 29.6 11/16/2017 1523   MCHC 34.5 11/16/2017 1523   RDW 12.0 11/16/2017 1523    LYMPHSABS 1.4 11/16/2017 1523   MONOABS 0.7 11/16/2017 1523   EOSABS 0.0 11/16/2017 1523   BASOSABS 0.0 11/16/2017 1523    No results found for: "POCLITH", "LITHIUM"   No results found for: "PHENYTOIN", "PHENOBARB", "VALPROATE", "CBMZ"   .res Assessment: Plan:    Plan:  PDMP reviewed  Lorazepam 0.43m - taking 3 tablets daily for anxiety - has a 4th dose  as needed. Lexapro 149m  RTC 6 weeks   Patient advised to contact office with any  questions, adverse effects, or acute worsening in signs and symptoms.    Time spent with patient was 20 minutes. Greater than 50% of face to face time with patient was spent on counseling and coordination of care.    Diagnoses and all orders for this visit:  Generalized anxiety disorder -     LORazepam (ATIVAN) 0.5 MG tablet; Take one tablet four times daily for increased anxiety. -     escitalopram (LEXAPRO) 10 MG tablet; Take 1 tablet (10 mg total) by mouth daily.     Please see After Visit Summary for patient specific instructions.  Future Appointments  Date Time Provider [place]  02/25/2023  9:50 AM Shamleffer, Melanie Crazier, MD [facility] None    No orders of the defined types were placed in this encounter.   -------------------------------

## 2022-11-25 DIAGNOSIS — I10 Essential (primary) hypertension: Secondary | ICD-10-CM | POA: Diagnosis not present

## 2022-11-25 DIAGNOSIS — G4733 Obstructive sleep apnea (adult) (pediatric): Secondary | ICD-10-CM | POA: Diagnosis not present

## 2022-11-30 DIAGNOSIS — F419 Anxiety disorder, unspecified: Secondary | ICD-10-CM | POA: Diagnosis not present

## 2022-11-30 DIAGNOSIS — F322 Major depressive disorder, single episode, severe without psychotic features: Secondary | ICD-10-CM | POA: Diagnosis not present

## 2022-11-30 DIAGNOSIS — E039 Hypothyroidism, unspecified: Secondary | ICD-10-CM | POA: Diagnosis not present

## 2022-11-30 DIAGNOSIS — E1169 Type 2 diabetes mellitus with other specified complication: Secondary | ICD-10-CM | POA: Diagnosis not present

## 2022-11-30 DIAGNOSIS — I10 Essential (primary) hypertension: Secondary | ICD-10-CM | POA: Diagnosis not present

## 2022-11-30 DIAGNOSIS — H9190 Unspecified hearing loss, unspecified ear: Secondary | ICD-10-CM | POA: Diagnosis not present

## 2022-11-30 DIAGNOSIS — E7800 Pure hypercholesterolemia, unspecified: Secondary | ICD-10-CM | POA: Diagnosis not present

## 2022-12-02 ENCOUNTER — Ambulatory Visit: Payer: BC Managed Care – PPO | Admitting: Psychiatry

## 2022-12-08 DIAGNOSIS — H6123 Impacted cerumen, bilateral: Secondary | ICD-10-CM | POA: Diagnosis not present

## 2022-12-15 ENCOUNTER — Ambulatory Visit (INDEPENDENT_AMBULATORY_CARE_PROVIDER_SITE_OTHER): Payer: BC Managed Care – PPO | Admitting: Psychiatry

## 2022-12-15 DIAGNOSIS — F411 Generalized anxiety disorder: Secondary | ICD-10-CM | POA: Diagnosis not present

## 2022-12-15 NOTE — Progress Notes (Signed)
[facility] Counselor/Therapist Progress Note  Patient ID: Ashlee Dennis, MRN: [id number],    Date: 12/15/2022  Time Spent: 55 minutes   Treatment Type: Individual Therapy  Reported Symptoms: anxiety, some sadness re: letting go of "my old house"   Mental Status Exam:  Appearance:   Casual     Behavior:  Appropriate, Sharing, and Motivated  Motor:  Normal  Speech/Language:   Clear and Coherent  Affect:  anxious  Mood:  anxious  Thought process:  goal directed  Thought content:    WNL  Sensory/Perceptual disturbances:    WNL  Orientation:  oriented to person, place, time/date, situation, day of week, month of year, year, and stated date of December 15, 2022  Attention:  Good  Concentration:  Good  Memory:  WNL  Fund of knowledge:   Good  Insight:    Good  Judgment:   Good  Impulse Control:  Good   Risk Assessment: Danger to Self:  No Self-injurious Behavior: No Danger to Others: No Duty to Warn:no Physical Aggression / Violence:No  Access to Firearms a concern: No  Gang Involvement:No   Subjective:  Patient today in session reporting anxiety related to family, husband's health, personal, and a sale of former home. Difficulty accepting husband's health condition that has really limited him and patient. Overthinking some worse today, "making my anxiety worse". Still working with her cpap machine and is to follow up on mouthguard and she has ordered. Her former home for years, did sell so she's having mixed feelings of "glad it sold, but also some grief as that had been her home for a long time. Able to process her grief in session today, which also stirred up some positive feelings/memories and some more difficult feelings/memories. 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  Interventions: Cognitive Behavioral Therapy and Ego-Supportive  Long-term goal: Reduce overall level, frequency, and intensity of the anxiety so that daily functioning is not impaired. Short-term goal: Describe current and past experiences with specific fears, prominent worries, and anxiety symptoms including their impact on functioning and attempts to resolve it.  Strategies: Identify, challenge, and replace fearful/anxious self-talk with positive, realistic, and empowering self-talk  Diagnosis:   ICD-10-CM   1. Generalized anxiety disorder  F41.1      Plan: Patient actively participating in session today focusing on some of her progress in trying to set healthier limits for herself.  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  That allows patient to get out some and have what she refers to as "Laketra Beth time" but occasionally that can be disrupted or not happen for 1 reason or another and she notices how much she misses it.  Is trying to guard that time a little more as she knows it keeps her more mentally healthy as she helps with caregiving for her husband.  Is more assertive and enjoys time when she can be out and around friends or family.  Anxiety and sadness seems some better today.  Is making progress as noted above and needs to continue her work with goal-directed behaviors to keep moving forward in a  healthier direction. Encouraged patient and practicing more positive and self affirming behaviors as noted in session including: Stay in the present and focusing on what she can change or control, getting outside and walking some each day, remain in touch with people who are supportive, look more for the positives versus negatives each day including within herself, healthy nutrition and exercise, not assuming worst-case scenarios, reduce her overthinking and  over analyzing, positive self talk, letting go of things from the past that can hold her back now from moving forward, and realize the strength she shows working with goal-directed behaviors to move in a direction that supports her improved emotional health.  Goal review and progress/challenges noted with patient.  Next appointment within 4 to 5 weeks.  This record has been created using [employer].  Chart creation errors have been sought, but may not always have been located and corrected.  Such creation errors do not reflect on the standard of medical care provided.   Shanon Ace, LCSW

## 2023-01-17 DIAGNOSIS — G4733 Obstructive sleep apnea (adult) (pediatric): Secondary | ICD-10-CM | POA: Diagnosis not present

## 2023-01-24 DIAGNOSIS — R0781 Pleurodynia: Secondary | ICD-10-CM | POA: Diagnosis not present

## 2023-01-25 DIAGNOSIS — R635 Abnormal weight gain: Secondary | ICD-10-CM | POA: Diagnosis not present

## 2023-01-25 DIAGNOSIS — E1169 Type 2 diabetes mellitus with other specified complication: Secondary | ICD-10-CM | POA: Diagnosis not present

## 2023-01-25 DIAGNOSIS — G4733 Obstructive sleep apnea (adult) (pediatric): Secondary | ICD-10-CM | POA: Diagnosis not present

## 2023-01-25 DIAGNOSIS — F419 Anxiety disorder, unspecified: Secondary | ICD-10-CM | POA: Diagnosis not present

## 2023-01-26 ENCOUNTER — Ambulatory Visit (INDEPENDENT_AMBULATORY_CARE_PROVIDER_SITE_OTHER): Payer: BC Managed Care – PPO | Admitting: Psychiatry

## 2023-01-26 DIAGNOSIS — F411 Generalized anxiety disorder: Secondary | ICD-10-CM

## 2023-01-26 NOTE — Progress Notes (Signed)
[facility] Counselor/Therapist Progress Note  Patient ID: Ashlee Dennis, MRN: [id number],    Date: 01/26/2023  Time Spent: 50 minutes   Treatment Type: Individual Therapy  Reported Symptoms: anxiety (improving)  Mental Status Exam:  Appearance:   Casual     Behavior:  Appropriate, Sharing, and Motivated  Motor:  Normal  Speech/Language:   Clear and Coherent  Affect:  anxious  Mood:  anxious  Thought process:  goal directed  Thought content:    Some obsessive thoughts  Sensory/Perceptual disturbances:    WNL  Orientation:  oriented to person, place, time/date, situation, day of week, month of year, year, and stated date of January 26, 2023  Attention:  Good  Concentration:  Good  Memory:  WNL  Fund of knowledge:   Good  Insight:    Good  Judgment:   Good  Impulse Control:  Good   Risk Assessment: Danger to Self:  No Self-injurious Behavior: No Danger to Others: No Duty to Warn:no Physical Aggression / Violence:No  Access to Firearms a concern: No  Gang Involvement:No   Subjective:  Patient today reporting anxiety mostly related to personal, husband's health challenges, and breaking a rib recently at home. Anxiety relating to husband's health is what patient needed to process today as she was feeling anxious (and some sadness) re: some family property where there may be changes in upcoming weeks. Overthinking has decreased and patient is continuing to work on this. Acknowledges that her overthinking continues and worked on this more in session today. Difficulty adjusting to cpap machine and is to get a "mouthguard which doctor feels will help.  As a result of breaking her rib, she has not been as mobile and is finding things that can help occupy her time since her movement is somewhat limited.  Supported her and her ability to see the positives in a situation where there is a lot of challenges and things that are not positive.  Able to talk about both the positives and "not so  positives" and feel understood.  Husband continues to have a caretaker to help with his medical needs especially with patient having a broken rib currently.  States that she continues to find ways to have "Ashlee Dennis time to take care of herself" which she realizes is very important and impacts her mood significantly.  Interventions: Cognitive Behavioral Therapy and Ego-Supportive  Long-term goal: Reduce overall level, frequency, and intensity of the anxiety so that daily functioning is not impaired. Short-term goal: Describe current and past experiences with specific fears, prominent worries, and anxiety symptoms including their impact on functioning and attempts to resolve it.  Strategies: Identify, challenge, and replace fearful/anxious self-talk with positive, realistic, and empowering self-talk  Diagnosis:   ICD-10-CM   1. Generalized anxiety disorder  F41.1      Plan:  Patient participating well in session today as she shared some changes in her husband's health, family situation, and her own personal situation in coping with her husband struggles and how it impacts their life together.  Is continuing to be motivated and making progress and needs to continue working with goal-directed behaviors in order to keep moving and forward direction. Encouraged patient in her practice of more positive and self affirming behaviors as noted in session including: Remain in the present focusing on what she can change, getting outside and walking some each day, stay in touch with people who are supportive, looking more for the positives versus negatives daily within herself,  healthy nutrition and exercise, not assuming worst-case scenarios, reduce her overthinking and over analyzing, positive self talk, letting go of things from the past that can hold her back now from moving forward, and recognize the strengths she shows working with goal-directed behaviors to move in a direction that supports her improved  emotional health and overall wellbeing.  Goal review and progress/challenges noted with patient.  Next appointment within 4 weeks.  This record has been created using [employer].  Chart creation errors have been sought, but may not always have been located and corrected.  Such creation errors do not reflect on the standard of medical care provided.   Mathis Fare, LCSW

## 2023-01-27 DIAGNOSIS — H903 Sensorineural hearing loss, bilateral: Secondary | ICD-10-CM | POA: Diagnosis not present

## 2023-01-27 DIAGNOSIS — Q8789 Other specified congenital malformation syndromes, not elsewhere classified: Secondary | ICD-10-CM | POA: Diagnosis not present

## 2023-02-02 DIAGNOSIS — S63635A Sprain of interphalangeal joint of left ring finger, initial encounter: Secondary | ICD-10-CM | POA: Diagnosis not present

## 2023-02-21 ENCOUNTER — Ambulatory Visit (INDEPENDENT_AMBULATORY_CARE_PROVIDER_SITE_OTHER): Payer: BC Managed Care – PPO | Admitting: Adult Health

## 2023-02-21 ENCOUNTER — Encounter: Payer: Self-pay | Admitting: Adult Health

## 2023-02-21 DIAGNOSIS — F411 Generalized anxiety disorder: Secondary | ICD-10-CM | POA: Diagnosis not present

## 2023-02-21 MED ORDER — LORAZEPAM 0.5 MG PO TABS: ORAL_TABLET | ORAL | 2 refills | Status: DC

## 2023-02-21 MED ORDER — ESCITALOPRAM OXALATE 10 MG PO TABS: 10.0000 mg | ORAL_TABLET | Freq: Every day | ORAL | 3 refills | Status: DC

## 2023-02-21 NOTE — Progress Notes (Signed)
Ashlee Dennis [id number] 07/31/1971 52 y.o.  Subjective:   Patient ID:  Ashlee Dennis is a 52 y.o. (DOB September 28, 1971) female.  Chief Complaint: No chief complaint on file.   HPI Kenae Petruska presents to the office today for follow-up of GAD.  Describes mood today as "ok". Pleasant. Decreased tearfulness. Mood symptoms - reports decreased depression, anxiety, and irritability - "a little bit of each". Reports some worry, rumination, and over thinking - "not a lot". Mood has been consistent. Stating "I feel like I'm doing alright". Feels like medications are helpful. Stable interest and motivation. Taking medications as prescribed.  Energy levels improved. Active, has a regular exercise routine. Enjoys some usual interests and activities. Married. Lives with husband of 23 years. Family local. Spending time with family. Appetite adequate. Weight gain - 155 pounds. Sleeps well most nights. Averages 6 to 7 hours using a mouth guard. Focus and concentration stable. 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 Completing tasks. Managing aspects of household. 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  Denies SI or HI.  Denies AH or VH. Denies self harm. Denies substance use.  Previous medication trials:  Lorazepam, Lexapro   PHQ2-9    Flowsheet Row Office Visit from 02/25/2017 in Primary Care at [facility] Visit from 05/05/2016 in Primary Care at [facility] Visit from 08/19/2015 in Primary Care at [facility] Total Score 0 0 0        Review of Systems:  Review of Systems  Musculoskeletal:  Negative for gait problem.  Neurological:  Negative for tremors.  Psychiatric/Behavioral:         Please refer to HPI    Medications: I have reviewed the patient's current medications.  Current Outpatient Medications  Medication Sig Dispense Refill   escitalopram (LEXAPRO) 10 MG tablet Take 1 tablet (10 mg total) by mouth daily. 30 tablet 2   fexofenadine (ALLEGRA) 180 MG tablet Take 180 mg by mouth daily.     fluticasone  (FLONASE) 50 MCG/ACT nasal spray as needed.  3   glimepiride (AMARYL) 2 MG tablet Take 1 tablet (2 mg total) by mouth daily with breakfast. 90 tablet 3   LORazepam (ATIVAN) 0.5 MG tablet Take one tablet four times daily for increased anxiety. 120 tablet 2   losartan (COZAAR) 50 MG tablet Take 1 tablet by mouth daily.     montelukast (SINGULAIR) 10 MG tablet Take 10 mg by mouth daily.     pantoprazole (PROTONIX) 40 MG tablet Take 1 tablet by mouth daily.  1   sitaGLIPtin (JANUVIA) 100 MG tablet Take 1 tablet (100 mg total) by mouth daily. 90 tablet 3   SUMAtriptan (IMITREX) 100 MG tablet Take 1 tablet (100 mg total) by mouth once as needed for up to 1 dose for migraine. May repeat in 2 hours if headache persists or recurs. 10 tablet 6   SYNTHROID 75 MCG tablet Take 1 tablet (75 mcg total) by mouth daily before breakfast. 90 tablet 3   No current facility-administered medications for this visit.    Medication Side Effects: None  Allergies:  Allergies  Allergen Reactions   Asa [Aspirin] Other (See Comments)    GERD   Aspartame And Phenylalanine     Joint aches   Chocolate Flavor     Other reaction(s): excessive belching   Codeine Hives   Dextroamphetamine Hives   Diclofenac Sodium Hives    Other reaction(s): hives   Erythromycin Hives   Metformin And Related Hives   Mint Chocolate Chip Flavor  Other     Splenda, Stevia, Truvia   Oxycodone Hives   Oxycodone-Acetaminophen     Other reaction(s): vomiting   Penicillins Hives   Pennsaid [Diclofenac Sodium] Hives   Semaglutide Nausea And Vomiting   Empagliflozin Nausea And Vomiting    Other reaction(s): diarrhea    Past Medical History:  Diagnosis Date   Allergy    Diabetes mellitus without complication (HCC)    GERD (gastroesophageal reflux disease)    Migraine    Thyroid disease     Past Medical History, Surgical history, Social history, and Family history were reviewed and updated as appropriate.   Please see review  of systems for further details on the patient's review from today.   Objective:   Physical Exam:  There were no vitals taken for this visit.  Physical Exam Constitutional:      General: She is not in acute distress. Musculoskeletal:        General: No deformity.  Neurological:     Mental Status: She is alert and oriented to person, place, and time.     Coordination: Coordination normal.  Psychiatric:        Attention and Perception: Attention and perception normal. She does not perceive auditory or visual hallucinations.        Mood and Affect: Mood normal. Mood is not anxious or depressed. Affect is not labile, blunt, angry or inappropriate.        Speech: Speech normal.        Behavior: Behavior normal.        Thought Content: Thought content normal. Thought content is not paranoid or delusional. Thought content does not include homicidal or suicidal ideation. Thought content does not include homicidal or suicidal plan.        Cognition and Memory: Cognition and memory normal.        Judgment: Judgment normal.     Comments: Insight intact     Lab Review:     Component Value Date/Time   NA 139 10/18/2022 0851   K 3.8 10/18/2022 0851   CL 102 10/18/2022 0851   CO2 30 10/18/2022 0851   GLUCOSE 235 (H) 10/18/2022 0851   BUN 16 10/18/2022 0851   CREATININE 0.66 10/18/2022 0851   CALCIUM 9.3 10/18/2022 0851   PROT 7.4 11/16/2017 1523   ALBUMIN 4.4 11/16/2017 1523   AST 26 11/16/2017 1523   ALT 21 11/16/2017 1523   ALKPHOS 50 11/16/2017 1523   BILITOT 0.5 11/16/2017 1523   GFRNONAA >60 11/16/2017 1523   GFRAA >60 11/16/2017 1523       Component Value Date/Time   WBC 14.6 (H) 11/16/2017 1523   RBC 5.00 11/16/2017 1523   HGB 15.0 11/16/2017 1532   HCT 44.0 11/16/2017 1532   PLT 289 11/16/2017 1523   MCV 85.8 11/16/2017 1523   MCH 29.6 11/16/2017 1523   MCHC 34.5 11/16/2017 1523   RDW 12.0 11/16/2017 1523   LYMPHSABS 1.4 11/16/2017 1523   MONOABS 0.7 11/16/2017  1523   EOSABS 0.0 11/16/2017 1523   BASOSABS 0.0 11/16/2017 1523    No results found for: "POCLITH", "LITHIUM"   No results found for: "PHENYTOIN", "PHENOBARB", "VALPROATE", "CBMZ"   .res Assessment: Plan:    Plan:  PDMP reviewed  Lorazepam 0.5mg  - taking 3 tablets daily for anxiety - has a 4th dose  as needed. Lexapro 10mg    RTC 3 months  Patient advised to contact office with any questions, adverse effects, or acute worsening in signs  and symptoms.    Time spent with patient was 20 minutes. Greater than 50% of face to face time with patient was spent on counseling and coordination of care.    There are no diagnoses linked to this encounter.   Please see After Visit Summary for patient specific instructions.  Future Appointments  Date Time Provider Department Center  02/21/2023 10:00 AM Marilynne Dupuis, Thereasa Solo, NP [facility] None  02/25/2023  9:50 AM Shamleffer, Konrad Dolores, MD [facility] None  03/16/2023 11:00 AM Mathis Fare, LCSW [facility] None    No orders of the defined types were placed in this encounter.   -------------------------------

## 2023-02-25 ENCOUNTER — Encounter: Payer: Self-pay | Admitting: Internal Medicine

## 2023-02-25 ENCOUNTER — Ambulatory Visit: Payer: BC Managed Care – PPO | Admitting: Internal Medicine

## 2023-02-25 VITALS — BP 110/66 | HR 83 | Ht 64.0 in | Wt 163.0 lb

## 2023-02-25 DIAGNOSIS — Z7984 Long term (current) use of oral hypoglycemic drugs: Secondary | ICD-10-CM

## 2023-02-25 DIAGNOSIS — E119 Type 2 diabetes mellitus without complications: Secondary | ICD-10-CM | POA: Diagnosis not present

## 2023-02-25 DIAGNOSIS — E039 Hypothyroidism, unspecified: Secondary | ICD-10-CM | POA: Diagnosis not present

## 2023-02-25 NOTE — Progress Notes (Signed)
Name: Ashlee Dennis  MRN/ DOB: [id number], [birth year]   Age/ Sex: 52 y.o., female    PCP: Renford Dills, MD   Reason for Endocrinology Evaluation: Type 2 Diabetes Mellitus     Date of Initial Endocrinology Visit: 10/18/2022    PATIENT IDENTIFIER: Ashlee Dennis is a 52 y.o. female with a past medical history of DM, HTN, migraine, generalized anxiety disorder, hypothyroid and OSA. The patient presented for initial endocrinology clinic visit on 10/18/2022 for consultative assistance with her diabetes management.    HPI: [name] was    Diagnosed with DM  at age 29 Prior Medications tried/Intolerance: jardiance and metformin - hives , rybelsus severe vomiting  Hemoglobin A1c has ranged from 6.2% in 2012, peaking at 7.0% in 2024.   On her initial visit to our clinic she had an A1c of 7.0%, we continue Januvia and glimepiride   THYROID HISTORY: Patient has been on chronic LT-for replacement.  She has had chronic thyromegaly with an ultrasound in 2014 not revealing any thyroid nodules.  Mother and two sister with thyroid disease    SUBJECTIVE:   During the last visit (10/18/2022): A1c 7.0%  Today (02/25/23): [name] is here for follow-up on diabetes management and hypothyroidism.  She checks her blood sugars multiple times daily through [employer]. The patient has not had hypoglycemic episodes    Per patient, she had an A1c of 7.0 % and a normal TSH through her PCP in April  Denies constipation or diarrhea  Denies local neck swelling  Denies palpitations  Denies tremors  No biotin     HOME DIABETES REGIMEN: Glimepiride 2 mg daily Januvia 100 mg daily  Synthroid  75 mcg daily     Statin: No ACE-I/ARB: Yes  CONTINUOUS GLUCOSE MONITORING RECORD INTERPRETATION    [id number]    DIABETIC COMPLICATIONS: Microvascular complications:   Denies: CKD Last eye exam: Completed 01/08/2022  Macrovascular complications:   Denies: CAD, PVD, CVA   PAST HISTORY: Past  Medical History:  Past Medical History:  Diagnosis Date   Allergy    Diabetes mellitus without complication (HCC)    GERD (gastroesophageal reflux disease)    Migraine    Thyroid disease    Past Surgical History:  Past Surgical History:  Procedure Laterality Date   ABDOMINAL HYSTERECTOMY     BREAST SURGERY     Cancer   KNEE ARTHROSCOPY     Teeth Implants     x 2    Social History:  reports that she has never smoked. She has never used smokeless tobacco. She reports that she does not drink alcohol and does not use drugs. Family History:  Family History  Problem Relation Age of Onset   Cancer Brother 55       renal cell carcinoma   Migraines Mother    Seizures Mother    Skin cancer Father    Thyroid disease Sister    Thyroid disease Sister    Colon cancer Neg Hx    Stomach cancer Neg Hx    Rectal cancer Neg Hx    Esophageal cancer Neg Hx    Liver cancer Neg Hx      HOME MEDICATIONS: Allergies as of 02/25/2023       Reactions   Asa [aspirin] Other (See Comments)   GERD   Aspartame And Phenylalanine    Joint aches   Chocolate Flavor    Other reaction(s): excessive belching   Codeine Hives   Dextroamphetamine Hives   Diclofenac  Sodium Hives   Other reaction(s): hives   Erythromycin Hives   Metformin And Related Hives   Mint Chocolate Chip Flavor    Other    Splenda, Stevia, Truvia   Oxycodone Hives   Oxycodone-acetaminophen    Other reaction(s): vomiting   Penicillins Hives   Pennsaid [diclofenac Sodium] Hives   Semaglutide Nausea And Vomiting   Empagliflozin Nausea And Vomiting   Other reaction(s): diarrhea        Medication List        Accurate as of Feb 25, 2023 10:04 AM. If you have any questions, ask your nurse or doctor.          escitalopram 10 MG tablet Commonly known as: LEXAPRO Take 1 tablet (10 mg total) by mouth daily.   fexofenadine 180 MG tablet Commonly known as: ALLEGRA Take 180 mg by mouth daily.   fluticasone 50  MCG/ACT nasal spray Commonly known as: FLONASE as needed.   glimepiride 2 MG tablet Commonly known as: AMARYL Take 1 tablet (2 mg total) by mouth daily with breakfast.   LORazepam 0.5 MG tablet Commonly known as: ATIVAN Take one tablet four times daily for increased anxiety.   losartan 50 MG tablet Commonly known as: COZAAR Take 1 tablet by mouth daily.   montelukast 10 MG tablet Commonly known as: SINGULAIR Take 10 mg by mouth daily.   pantoprazole 40 MG tablet Commonly known as: PROTONIX Take 1 tablet by mouth daily.   sitaGLIPtin 100 MG tablet Commonly known as: Januvia Take 1 tablet (100 mg total) by mouth daily.   SUMAtriptan 100 MG tablet Commonly known as: IMITREX Take 1 tablet (100 mg total) by mouth once as needed for up to 1 dose for migraine. May repeat in 2 hours if headache persists or recurs.   Synthroid 75 MCG tablet Generic drug: levothyroxine Take 1 tablet (75 mcg total) by mouth daily before breakfast.         ALLERGIES: Allergies  Allergen Reactions   Asa [Aspirin] Other (See Comments)    GERD   Aspartame And Phenylalanine     Joint aches   Chocolate Flavor     Other reaction(s): excessive belching   Codeine Hives   Dextroamphetamine Hives   Diclofenac Sodium Hives    Other reaction(s): hives   Erythromycin Hives   Metformin And Related Hives   Mint Chocolate Chip Flavor    Other     Splenda, Stevia, Truvia   Oxycodone Hives   Oxycodone-Acetaminophen     Other reaction(s): vomiting   Penicillins Hives   Pennsaid [Diclofenac Sodium] Hives   Semaglutide Nausea And Vomiting   Empagliflozin Nausea And Vomiting    Other reaction(s): diarrhea     REVIEW OF SYSTEMS: A comprehensive ROS was conducted with the patient and is negative except as per HPI     OBJECTIVE:   VITAL SIGNS: BP 110/66 (BP Location: Left Arm, Patient Position: Sitting, Cuff Size: Large)   Pulse 83   Ht 5\' 4"  (1.626 m)   Wt 163 lb (73.9 kg)   SpO2 98%    BMI 27.98 kg/m    PHYSICAL EXAM:  General: Pt appears well and is in NAD  Neck: General: Supple without adenopathy or carotid bruits. Thyroid: Thyroid size normal.  No goiter or nodules appreciated.   Lungs: Clear with good BS bilat   Heart: RRR   Extremities:  Lower extremities - No pretibial edema.  Neuro: MS is good with appropriate affect, pt is alert  and Ox3    DM foot exam: 10/18/2022  The skin of the feet is intact without sores or ulcerations. The pedal pulses are 2+ on right and 2+ on left. The sensation is intact to a screening 5.07, 10 gram monofilament bilaterally   DATA REVIEWED:   Latest Reference Range & Units 10/18/22 08:51  Sodium 135 - 145 mEq/L 139  Potassium 3.5 - 5.1 mEq/L 3.8  Chloride 96 - 112 mEq/L 102  CO2 19 - 32 mEq/L 30  Glucose 70 - 99 mg/dL 161 (H)  BUN 6 - 23 mg/dL 16  Creatinine 0.96 - 0.45 mg/dL 4.09  Calcium 8.4 - 81.1 mg/dL 9.3  GFR >91.47 mL/min 101.85    Latest Reference Range & Units 10/18/22 08:51  TSH 0.35 - 5.50 uIU/mL 0.48     07/30/2022 BUN/Cr 17/0.620 GFR 108 LDL 63 Ma/Cr 1.230 Tg 182  TSH 3.9 A1c 7.0%   ASSESSMENT / PLAN / RECOMMENDATIONS:   1) Type 2 Diabetes Mellitus, optimally controlled, Without complications - Most recent A1c of 7.0 %. Goal A1c < 7.0 %.    -Per patient, she had a recent A1c of 7.0% at PCPs office, these records are not available -Patient with reported intolerance to metformin, Jardiance, and Rybelsus -We were unable to download her freestyle libre, I was able to manually review the data, and the patient has glycemic excursions with BG's in the 90s up to the 200s mg/DL -We entertain the idea of switching glimepiride to glipizide twice daily, patient would like to remain on the glimepiride at this time, we discussed low carbohydrate diet  MEDICATIONS: Continue glimepiride 2 mg daily before breakfast Continue Januvia 100 mg daily  EDUCATION / INSTRUCTIONS BG monitoring instructions: Patient is  instructed to check her blood sugars 3 times a day before meals Call [facility] if: BG persistently < 70  I reviewed the Rule of 15 for the treatment of hypoglycemia in detail with the patient. Literature supplied.   2) Diabetic complications:  Eye: Does not have known diabetic retinopathy.  Neuro/ Feet: Does not have known diabetic peripheral neuropathy. Renal: Patient does not have known baseline CKD. She is  on an ACEI/ARB at present.  3) Hypothyroidism:  -Patient is clinically euthyroid -Previous thyroid ultrasound did not reveal any thyroid nodules -TSH in January 2024 has been normal, per patient she had recent normal TSH through PCPs office, these records are not available -No change at this time  Medication Continue Synthroid 75 mcg daily  Follow-up in 6 months    Signed electronically by: Lyndle Herrlich, MD  [facility] End[facility]  [facility] [address] [place] [place], [place] [address] Phone: [phone] FAX: [phone]   CC: Renford Dills, MD [address] [place] [address] Phone: [phone]  Fax: [phone]    Return to Endocrinology clinic as below: Future Appointments  Date Time Provider Department Center  03/16/2023 11:00 AM Mathis Fare, LCSW [facility] None  05/24/2023 11:40 AM Mozingo, Thereasa Solo, NP [facility] None

## 2023-02-25 NOTE — Patient Instructions (Signed)
Take Glimepiride 2 mg , 1 tablet before Breakfast  Take Januvia 100 mg, 1 tablet before Breakfast      HOW TO TREAT LOW BLOOD SUGARS (Blood sugar LESS THAN 70 MG/DL) Please follow the RULE OF 15 for the treatment of hypoglycemia treatment (when your (blood sugars are less than 70 mg/dL)   STEP 1: Take 15 grams of carbohydrates when your blood sugar is low, which includes:  3-4 GLUCOSE TABS  OR 3-4 OZ OF JUICE OR REGULAR SODA OR ONE TUBE OF GLUCOSE GEL    STEP 2: RECHECK blood sugar in 15 MINUTES STEP 3: If your blood sugar is still low at the 15 minute recheck --> then, go back to STEP 1 and treat AGAIN with another 15 grams of carbohydrates.  

## 2023-03-16 ENCOUNTER — Ambulatory Visit (INDEPENDENT_AMBULATORY_CARE_PROVIDER_SITE_OTHER): Payer: BC Managed Care – PPO | Admitting: Psychiatry

## 2023-03-16 DIAGNOSIS — D2361 Other benign neoplasm of skin of right upper limb, including shoulder: Secondary | ICD-10-CM | POA: Diagnosis not present

## 2023-03-16 DIAGNOSIS — F411 Generalized anxiety disorder: Secondary | ICD-10-CM

## 2023-03-16 DIAGNOSIS — L818 Other specified disorders of pigmentation: Secondary | ICD-10-CM | POA: Diagnosis not present

## 2023-03-16 DIAGNOSIS — L814 Other melanin hyperpigmentation: Secondary | ICD-10-CM | POA: Diagnosis not present

## 2023-03-16 NOTE — Progress Notes (Signed)
[facility] Counselor/Therapist Progress Note  Patient ID: Ashlee Dennis, MRN: [id number],    Date: 03/16/2023  Time Spent: 48 minutes   Treatment Type: Individual Therapy  Reported Symptoms:  anxiety (some increase due to husband's recent dx of kidney cancer), some depression but "lowered"  Mental Status Exam:  Appearance:   Casual     Behavior:  Appropriate, Sharing, and Motivated  Motor:  Normal  Speech/Language:   Clear and Coherent  Affect:  anxious  Mood:  anxious and some depression   Thought process:  goal directed  Thought content:    WNL  Sensory/Perceptual disturbances:    WNL  Orientation:  oriented to person, place, time/date, situation, day of week, month of year, year, and stated date of March 16, 2023  Attention:  Good  Concentration:  Good  Memory:  WNL  Fund of knowledge:   Good  Insight:    Good  Judgment:   Good  Impulse Control:  Good   Risk Assessment: Danger to Self:  No Self-injurious Behavior: No Danger to Others: No Duty to Warn:no Physical Aggression / Violence:No  Access to Firearms a concern: No  Gang Involvement:No   Subjective:   Patient today reporting anxiety and some depression mostly related to death of both parents and her husband's continued physical decline. Today talking through a lot of her concerns, anxieties, and sadness today, often tries to laugh to prevent tearfulness. Noticing some very gradual decline with husband, but also noticing some positives. Does have good support. States her broken rib has healed although still some soreness. Continues today processing her concerns about husband's health status and especially more recently due to his diagnosis of kidney cancer and "the unknowns".  Showing more strength today.  Less overthinking.  Is adjusting better to CPAP machine with the use of some type of mouthguard that doctor recommended and patient has found helpful.  Continues working hard to hold onto some positives, especially  when new health concerns or challenges arise with husband or herself.  Continues to be able to talk more openly about the "positives and the not so positives" and appreciates being able to feel understood.  Encouraged patient in her continued to have some personal time just for herself as she has expressed that helps her mood and outlook.  Interventions: Cognitive Behavioral Therapy and Ego-Supportive  Long-term goal: Reduce overall level, frequency, and intensity of the anxiety so that daily functioning is not impaired. Short-term goal: Describe current and past experiences with specific fears, prominent worries, and anxiety symptoms including their impact on functioning and attempts to resolve it.  Strategies: Identify, challenge, and replace fearful/anxious self-talk with positive, realistic, and empowering self-talk  Diagnosis:   ICD-10-CM   1. Generalized anxiety disorder  F41.1      Plan:  Patient today showing good participation in session as she continue to work on some anxiety, depression, and has been successful in decreasing her overthinking.  Maintains a good sense of humor which is helping her and tough times.  Has made progress and needs to continue working with goal-directed behaviors to keep moving in a forward direction. Encouraged patient in her practice of more positive and self affirming behaviors as noted in session including: Staying in touch with people who are supportive of her, look more for the positives versus negatives daily within herself and others, healthy nutrition and exercise, stay in the present focusing on what she can change, refrain from assuming worst-case scenarios, reduce her overthinking  and over analyzing, positive self talk, continue her on positive self-care as she continues to support her husband who has ongoing health issues and needs a sitter during the day which they have for him on a regular basis, and recognize the strength she shows working with  goal-directed behaviors to move in a direction that supports her improved emotional health and outlook.  Goal review and progress/challenges noted with patient.  Next appointment within 4 weeks.   Mathis Fare, LCSW

## 2023-03-22 DIAGNOSIS — F419 Anxiety disorder, unspecified: Secondary | ICD-10-CM | POA: Diagnosis not present

## 2023-03-22 DIAGNOSIS — E1169 Type 2 diabetes mellitus with other specified complication: Secondary | ICD-10-CM | POA: Diagnosis not present

## 2023-03-22 DIAGNOSIS — R635 Abnormal weight gain: Secondary | ICD-10-CM | POA: Diagnosis not present

## 2023-03-22 DIAGNOSIS — G4733 Obstructive sleep apnea (adult) (pediatric): Secondary | ICD-10-CM | POA: Diagnosis not present

## 2023-03-23 DIAGNOSIS — H524 Presbyopia: Secondary | ICD-10-CM | POA: Diagnosis not present

## 2023-03-23 DIAGNOSIS — H52223 Regular astigmatism, bilateral: Secondary | ICD-10-CM | POA: Diagnosis not present

## 2023-03-23 DIAGNOSIS — E119 Type 2 diabetes mellitus without complications: Secondary | ICD-10-CM | POA: Diagnosis not present

## 2023-03-23 DIAGNOSIS — H5213 Myopia, bilateral: Secondary | ICD-10-CM | POA: Diagnosis not present

## 2023-04-21 ENCOUNTER — Telehealth: Payer: Self-pay | Admitting: Internal Medicine

## 2023-04-21 DIAGNOSIS — F419 Anxiety disorder, unspecified: Secondary | ICD-10-CM | POA: Diagnosis not present

## 2023-04-21 DIAGNOSIS — R635 Abnormal weight gain: Secondary | ICD-10-CM | POA: Diagnosis not present

## 2023-04-21 DIAGNOSIS — E1169 Type 2 diabetes mellitus with other specified complication: Secondary | ICD-10-CM | POA: Diagnosis not present

## 2023-04-21 DIAGNOSIS — G4733 Obstructive sleep apnea (adult) (pediatric): Secondary | ICD-10-CM | POA: Diagnosis not present

## 2023-04-21 NOTE — Telephone Encounter (Signed)
Placed in providers box.

## 2023-04-21 NOTE — Telephone Encounter (Signed)
Patient dropped of Lab Results from Dr Jeanice Lim. Labs placed in provider file at front desk.  Patient is requesting a call back at [phone].

## 2023-04-22 ENCOUNTER — Other Ambulatory Visit: Payer: Self-pay | Admitting: Internal Medicine

## 2023-04-22 ENCOUNTER — Ambulatory Visit (INDEPENDENT_AMBULATORY_CARE_PROVIDER_SITE_OTHER): Payer: BC Managed Care – PPO | Admitting: Psychiatry

## 2023-04-22 DIAGNOSIS — F411 Generalized anxiety disorder: Secondary | ICD-10-CM | POA: Diagnosis not present

## 2023-04-22 MED ORDER — GLIMEPIRIDE 2 MG PO TABS: 3.0000 mg | ORAL_TABLET | Freq: Every day | ORAL | 3 refills | Status: DC

## 2023-04-22 NOTE — Progress Notes (Addendum)
      [facility] Counselor/Therapist Progress Note  Patient ID: Ashlee Dennis, MRN: [id number],    Date: 04/22/2023  Time Spent:  50 minutes  Treatment Type: Individual Therapy  Reported Symptoms: anxiety, sadness re: deaths, parents, and her husband (multiple cancer issues, diabetes); depression some decreased  Mental Status Exam:  Appearance:   Casual     Behavior:  Appropriate, Sharing, and Motivated  Motor:  Normal  Speech/Language:   Clear and Coherent  Affect:  Anxiety, some depression  Mood:  anxious and some depression  Thought process:  goal directed  Thought content:    WNL  Sensory/Perceptual disturbances:    WNL  Orientation:  oriented to person, place, time/date, situation, day of week, month of year, year, and stated date of April 22, 2023  Attention:  Good  Concentration:  Good  Memory:  WNL  Fund of knowledge:   Good  Insight:    Good  Judgment:   Good  Impulse Control:  Good   Risk Assessment: Danger to Self:  No Self-injurious Behavior: No Danger to Others: No Duty to Warn:no Physical Aggression / Violence:No  Access to Firearms a concern: No  Gang Involvement:No   Subjective:  Patient in for session today and reporting anxiety and sadness as noted above but also notices her depression has decreased and her anxiety "has decreased some also". Senses progress as evidenced by her acceptance of parents being "gone". Gravitating more towards "family that are still around and also friends". States she needs to do more reaching out to friends and "did a little bit of that last week".  Shares more decline she notices with husband, "but trying to live more and be more with people who understand in the present".  Knows that her husband's illnesses are life-threatening for him and morning to make the best of life possible for him.  Some tearfulness but also expression of gratitude especially as she talked about supportive friends and family.  Talked through some of her  anxieties and concerns and continues to try to use lightheartedness and occasional laughing as part of her coping skills.  Interventions: Cognitive Behavioral Therapy and Ego-Supportive  Long-term goal: Reduce overall level, frequency, and intensity of the anxiety so that daily functioning is not impaired. Short-term goal: Describe current and past experiences with specific fears, prominent worries, and anxiety symptoms including their impact on functioning and attempts to resolve it.  Strategies: Identify, challenge, and replace fearful/anxious self-talk with positive, realistic, and empowering self-talk   Diagnosis:   ICD-10-CM   1. Generalized anxiety disorder  F41.1      Plan:  Patient in session today actively participating as she focused more on her anxiety, depression, both of which are some better.  Most of her current anxiety and depression are related to her husband's ongoing health issues.  To continue working with goal-directed behaviors in order to keep moving in a forward direction.  Goal review and progress/challenges noted with patient.  Next appointment within 3 weeks.   Mathis Fare, LCSW

## 2023-04-22 NOTE — Telephone Encounter (Signed)
Patient has been advised and verbalized understanding.

## 2023-04-25 DIAGNOSIS — M7062 Trochanteric bursitis, left hip: Secondary | ICD-10-CM | POA: Diagnosis not present

## 2023-04-25 DIAGNOSIS — M545 Low back pain, unspecified: Secondary | ICD-10-CM | POA: Diagnosis not present

## 2023-05-11 DIAGNOSIS — Z01419 Encounter for gynecological examination (general) (routine) without abnormal findings: Secondary | ICD-10-CM | POA: Diagnosis not present

## 2023-05-19 DIAGNOSIS — F419 Anxiety disorder, unspecified: Secondary | ICD-10-CM | POA: Diagnosis not present

## 2023-05-19 DIAGNOSIS — R635 Abnormal weight gain: Secondary | ICD-10-CM | POA: Diagnosis not present

## 2023-05-19 DIAGNOSIS — G4733 Obstructive sleep apnea (adult) (pediatric): Secondary | ICD-10-CM | POA: Diagnosis not present

## 2023-05-19 DIAGNOSIS — E1169 Type 2 diabetes mellitus with other specified complication: Secondary | ICD-10-CM | POA: Diagnosis not present

## 2023-05-20 ENCOUNTER — Ambulatory Visit: Payer: BC Managed Care – PPO | Admitting: Psychiatry

## 2023-05-20 ENCOUNTER — Telehealth: Payer: Self-pay

## 2023-05-20 NOTE — Telephone Encounter (Signed)
Patient states that her fasting bs have been running high. Patient taking the Glimepride  1.5 tablet  and the Januvia once daily.   05/19/23   225- 5:40am   221 8am   05/19/24  156 6:44am

## 2023-05-20 NOTE — Telephone Encounter (Signed)
Patient advised and verbalized understanding 

## 2023-05-23 ENCOUNTER — Ambulatory Visit (INDEPENDENT_AMBULATORY_CARE_PROVIDER_SITE_OTHER): Payer: BC Managed Care – PPO | Admitting: Psychiatry

## 2023-05-23 DIAGNOSIS — F411 Generalized anxiety disorder: Secondary | ICD-10-CM | POA: Diagnosis not present

## 2023-05-23 NOTE — Progress Notes (Signed)
      [facility] Counselor/Therapist Progress Note  Patient ID: Ranaya Knoblauch, MRN: [id number],    Date: 05/23/2023  Time Spent: 50 minutes   Treatment Type: Individual Therapy  Reported Symptoms: anxiety, living with uncertainties with husband's illness  Mental Status Exam:  Appearance:   Casual     Behavior:  Appropriate, Sharing, and Motivated  Motor:  Normal  Speech/Language:   Clear and Coherent  Affect:  anxious  Mood:  anxious  Thought process:  goal directed  Thought content:    WNL  Sensory/Perceptual disturbances:    WNL  Orientation:  oriented to person, place, time/date, situation, day of week, month of year, year, and stated date of Aug. 12, 2024  Attention:  Good  Concentration:  Good  Memory:  WNL  Fund of knowledge:   Good  Insight:    Good  Judgment:   Good  Impulse Control:  Good   Risk Assessment: Danger to Self:  No Self-injurious Behavior: No Danger to Others: No Duty to Warn:no Physical Aggression / Violence:No  Access to Firearms a concern: No  Gang Involvement:No   Subjective: Patient in for session and focusing on anxiety especially considering her husband's illness which is unpredictable at times but currently she feels it is more "leveled out". Processing today more of her personal stressors especially caregiver role with husband. Less depression reported. Over all, "better emotionally", although he will some mood shifts "but not as bad".  Able to see some humor in certain situations, which she shared in session today.  Often uses humor as part of her coping and shares situations in the past where this has been true with losses previously.  Intentionally having more family time as they are able within extended family.  Tries to take advantage of good days for her husband and spend quality time together as he is able in certain activities.  Because of husband's illness being life-threatening, there is more focus for her to enjoy when days are better and  cope as effectively as possible when they are not.  During times like this when symptoms are not as bad, she is very aware that her mood is better and she feels like getting out more which is helpful for her.  Interventions: Cognitive Behavioral Therapy and Ego-Supportive  Long-term goal: Reduce overall level, frequency, and intensity of the anxiety so that daily functioning is not impaired. Short-term goal: Describe current and past experiences with specific fears, prominent worries, and anxiety symptoms including their impact on functioning and attempts to resolve it.  Strategies: Identify, challenge, and replace fearful/anxious self-talk with positive, realistic, and empowering self-talk  Diagnosis:   ICD-10-CM   1. Generalized anxiety disorder  F41.1      Plan:  Patient actively participating in session today focusing further on her anxiety mostly surrounding her husband's illness and the unpredictability of it.  Most recently, he has had some better days and she tries to capitalize on those understandably.  She has made progress and working on her goals and needs to continue working with goal-directed behaviors to keep moving in a forward direction.  Goal review and progress/challenges noted with patient.  Next appointment within 3 weeks.   Mathis Fare, LCSW

## 2023-05-24 ENCOUNTER — Encounter: Payer: Self-pay | Admitting: Adult Health

## 2023-05-24 ENCOUNTER — Ambulatory Visit (INDEPENDENT_AMBULATORY_CARE_PROVIDER_SITE_OTHER): Payer: BC Managed Care – PPO | Admitting: Adult Health

## 2023-05-24 DIAGNOSIS — F411 Generalized anxiety disorder: Secondary | ICD-10-CM | POA: Diagnosis not present

## 2023-05-24 MED ORDER — LORAZEPAM 0.5 MG PO TABS: ORAL_TABLET | ORAL | 2 refills | Status: DC

## 2023-05-24 NOTE — Progress Notes (Signed)
Ashlee Dennis [id number] 11-Jun-1971 52 y.o.  Subjective:   Patient ID:  Ashlee Dennis is a 52 y.o. (DOB 01-07-1971) female.  Chief Complaint: No chief complaint on file.   HPI Ashlee Dennis presents to the office today for follow-up of GAD.  Describes mood today as "ok". Pleasant. Decreased tearfulness. Mood symptoms - reports some depression, anxiety, and irritability. Denies panic attacks. Denies worry, rumination, and over thinking. Mood has been consistent. Stating "I feel like I'm doing way better". Feels like medications are helpful. Stable interest and motivation. Taking medications as prescribed.  Energy levels improved. Active, has a regular exercise routine. Enjoys some usual interests and activities. Married. Lives with husband of 23 years - cats. Family local. Spending time with family. Appetite adequate. Weight gain - 157.6 pounds. Sleeps well most nights. Averages 6 to 7 hours. Focus and concentration stable. 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 Completing tasks. Managing aspects of household. 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  Denies SI or HI.  Denies AH or VH. Denies self harm. Denies substance use.  Previous medication trials:  Lorazepam, Lexapro  PHQ2-9    Flowsheet Row Office Visit from 02/25/2017 in Primary Care at [facility] Visit from 05/05/2016 in Primary Care at [facility] Visit from 08/19/2015 in Primary Care at [facility] Total Score 0 0 0       Review of Systems:  Review of Systems  Musculoskeletal:  Negative for gait problem.  Neurological:  Negative for tremors.  Psychiatric/Behavioral:         Please refer to HPI    Medications: I have reviewed the patient's current medications.  Current Outpatient Medications  Medication Sig Dispense Refill   escitalopram (LEXAPRO) 10 MG tablet Take 1 tablet (10 mg total) by mouth daily. 90 tablet 3   fexofenadine (ALLEGRA) 180 MG tablet Take 180 mg by mouth daily.     fluticasone (FLONASE) 50 MCG/ACT nasal spray as needed.   3   glimepiride (AMARYL) 2 MG tablet Take 1.5 tablets (3 mg total) by mouth daily with breakfast. 135 tablet 3   LORazepam (ATIVAN) 0.5 MG tablet Take one tablet four times daily for increased anxiety. 120 tablet 2   losartan (COZAAR) 50 MG tablet Take 1 tablet by mouth daily.     montelukast (SINGULAIR) 10 MG tablet Take 10 mg by mouth daily.     pantoprazole (PROTONIX) 40 MG tablet Take 1 tablet by mouth daily.  1   sitaGLIPtin (JANUVIA) 100 MG tablet Take 1 tablet (100 mg total) by mouth daily. 90 tablet 3   SUMAtriptan (IMITREX) 100 MG tablet Take 1 tablet (100 mg total) by mouth once as needed for up to 1 dose for migraine. May repeat in 2 hours if headache persists or recurs. 10 tablet 6   SYNTHROID 75 MCG tablet Take 1 tablet (75 mcg total) by mouth daily before breakfast. 90 tablet 3   No current facility-administered medications for this visit.    Medication Side Effects: None  Allergies:  Allergies  Allergen Reactions   Asa [Aspirin] Other (See Comments)    GERD   Aspartame And Phenylalanine     Joint aches   Chocolate Flavor     Other reaction(s): excessive belching   Codeine Hives   Dextroamphetamine Hives   Diclofenac Sodium Hives    Other reaction(s): hives   Erythromycin Hives   Metformin And Related Hives   Mint Chocolate Chip Flavor    Other     Splenda, Florentina Addison, Lowella Grip  Oxycodone Hives   Oxycodone-Acetaminophen     Other reaction(s): vomiting   Penicillins Hives   Pennsaid [Diclofenac Sodium] Hives   Semaglutide Nausea And Vomiting   Empagliflozin Nausea And Vomiting    Other reaction(s): diarrhea    Past Medical History:  Diagnosis Date   Allergy    Diabetes mellitus without complication (HCC)    GERD (gastroesophageal reflux disease)    Migraine    Thyroid disease     Past Medical History, Surgical history, Social history, and Family history were reviewed and updated as appropriate.   Please see review of systems for further details on the  patient's review from today.   Objective:   Physical Exam:  There were no vitals taken for this visit.  Physical Exam Constitutional:      General: She is not in acute distress. Musculoskeletal:        General: No deformity.  Neurological:     Mental Status: She is alert and oriented to person, place, and time.     Coordination: Coordination normal.  Psychiatric:        Attention and Perception: Attention and perception normal. She does not perceive auditory or visual hallucinations.        Mood and Affect: Affect is not labile, blunt, angry or inappropriate.        Speech: Speech normal.        Behavior: Behavior normal.        Thought Content: Thought content normal. Thought content is not paranoid or delusional. Thought content does not include homicidal or suicidal ideation. Thought content does not include homicidal or suicidal plan.        Cognition and Memory: Cognition and memory normal.        Judgment: Judgment normal.     Comments: Insight intact     Lab Review:     Component Value Date/Time   NA 139 10/18/2022 0851   K 3.8 10/18/2022 0851   CL 102 10/18/2022 0851   CO2 30 10/18/2022 0851   GLUCOSE 235 (H) 10/18/2022 0851   BUN 16 10/18/2022 0851   CREATININE 0.66 10/18/2022 0851   CALCIUM 9.3 10/18/2022 0851   PROT 7.4 11/16/2017 1523   ALBUMIN 4.4 11/16/2017 1523   AST 26 11/16/2017 1523   ALT 21 11/16/2017 1523   ALKPHOS 50 11/16/2017 1523   BILITOT 0.5 11/16/2017 1523   GFRNONAA >60 11/16/2017 1523   GFRAA >60 11/16/2017 1523       Component Value Date/Time   WBC 14.6 (H) 11/16/2017 1523   RBC 5.00 11/16/2017 1523   HGB 15.0 11/16/2017 1532   HCT 44.0 11/16/2017 1532   PLT 289 11/16/2017 1523   MCV 85.8 11/16/2017 1523   MCH 29.6 11/16/2017 1523   MCHC 34.5 11/16/2017 1523   RDW 12.0 11/16/2017 1523   LYMPHSABS 1.4 11/16/2017 1523   MONOABS 0.7 11/16/2017 1523   EOSABS 0.0 11/16/2017 1523   BASOSABS 0.0 11/16/2017 1523    No results  found for: "POCLITH", "LITHIUM"   No results found for: "PHENYTOIN", "PHENOBARB", "VALPROATE", "CBMZ"   .res Assessment: Plan:    Plan:  PDMP reviewed  Lorazepam 0.5mg  - taking 3 tablets daily for anxiety - has a 4th dose  as needed. Lexapro 10mg    RTC 3 months  Patient advised to contact office with any questions, adverse effects, or acute worsening in signs and symptoms.    Discussed potential benefits, risk, and side effects of benzodiazepines to include potential risk  of tolerance and dependence, as well as possible drowsiness.  Advised patient not to drive if experiencing drowsiness and to take lowest possible effective dose to minimize risk of dependence and tolerance.   Diagnoses and all orders for this visit:  Generalized anxiety disorder -     LORazepam (ATIVAN) 0.5 MG tablet; Take one tablet four times daily for increased anxiety.     Please see After Visit Summary for patient specific instructions.  Future Appointments  Date Time Provider Department Center  07/04/2023 11:00 AM Mathis Fare, LCSW [facility] None  08/30/2023 10:50 AM Shamleffer, Konrad Dolores, MD [facility] None    No orders of the defined types were placed in this encounter.   -------------------------------

## 2023-05-27 DIAGNOSIS — E119 Type 2 diabetes mellitus without complications: Secondary | ICD-10-CM | POA: Diagnosis not present

## 2023-05-27 DIAGNOSIS — E1169 Type 2 diabetes mellitus with other specified complication: Secondary | ICD-10-CM | POA: Diagnosis not present

## 2023-05-27 DIAGNOSIS — F419 Anxiety disorder, unspecified: Secondary | ICD-10-CM | POA: Diagnosis not present

## 2023-05-27 DIAGNOSIS — Z0000 Encounter for general adult medical examination without abnormal findings: Secondary | ICD-10-CM | POA: Diagnosis not present

## 2023-05-27 DIAGNOSIS — N3946 Mixed incontinence: Secondary | ICD-10-CM | POA: Diagnosis not present

## 2023-05-27 DIAGNOSIS — H9190 Unspecified hearing loss, unspecified ear: Secondary | ICD-10-CM | POA: Diagnosis not present

## 2023-05-27 DIAGNOSIS — R350 Frequency of micturition: Secondary | ICD-10-CM | POA: Diagnosis not present

## 2023-05-27 DIAGNOSIS — E039 Hypothyroidism, unspecified: Secondary | ICD-10-CM | POA: Diagnosis not present

## 2023-06-02 DIAGNOSIS — J3089 Other allergic rhinitis: Secondary | ICD-10-CM | POA: Diagnosis not present

## 2023-06-02 DIAGNOSIS — J310 Chronic rhinitis: Secondary | ICD-10-CM | POA: Diagnosis not present

## 2023-06-02 DIAGNOSIS — J301 Allergic rhinitis due to pollen: Secondary | ICD-10-CM | POA: Diagnosis not present

## 2023-06-06 DIAGNOSIS — H903 Sensorineural hearing loss, bilateral: Secondary | ICD-10-CM | POA: Diagnosis not present

## 2023-06-06 DIAGNOSIS — Q8789 Other specified congenital malformation syndromes, not elsewhere classified: Secondary | ICD-10-CM | POA: Diagnosis not present

## 2023-06-06 DIAGNOSIS — H6123 Impacted cerumen, bilateral: Secondary | ICD-10-CM | POA: Diagnosis not present

## 2023-06-30 DIAGNOSIS — E1169 Type 2 diabetes mellitus with other specified complication: Secondary | ICD-10-CM | POA: Diagnosis not present

## 2023-06-30 DIAGNOSIS — E663 Overweight: Secondary | ICD-10-CM | POA: Diagnosis not present

## 2023-06-30 DIAGNOSIS — F419 Anxiety disorder, unspecified: Secondary | ICD-10-CM | POA: Diagnosis not present

## 2023-07-04 ENCOUNTER — Ambulatory Visit: Payer: BC Managed Care – PPO | Admitting: Psychiatry

## 2023-07-04 DIAGNOSIS — F411 Generalized anxiety disorder: Secondary | ICD-10-CM | POA: Diagnosis not present

## 2023-07-04 DIAGNOSIS — Z23 Encounter for immunization: Secondary | ICD-10-CM | POA: Diagnosis not present

## 2023-07-04 NOTE — Progress Notes (Signed)
      [facility] Counselor/Therapist Progress Note  Patient ID: Ashlee Dennis, MRN: [id number],    Date: 07/04/2023  Time Spent: 48 minutes   Treatment Type: Individual Therapy  Reported Symptoms: anxiety, living with uncertainties due to husband's illness, "doing better"   Mental Status Exam:  Appearance:   Neat     Behavior:  Appropriate, Sharing, and Motivated  Motor:  Normal  Speech/Language:   Clear and Coherent  Affect:  anxious  Mood:  anxious  Thought process:  goal directed  Thought content:    WNL  Sensory/Perceptual disturbances:    WNL  Orientation:  oriented to person, place, time/date, situation, day of week, month of year, year, and stated date of Sept. 23, 2024  Attention:  Good  Concentration:  Good  Memory:  WNL  Fund of knowledge:   Good  Insight:    Good  Judgment:   Good  Impulse Control:  Good   Risk Assessment: Danger to Self:  No Self-injurious Behavior: No Danger to Others: No Duty to Warn:no Physical Aggression / Violence:No  Access to Firearms a concern: No  Gang Involvement:No   Subjective:  Patient in session today reporting anxiety as main symptom and is mostly related to her husband's illness which has been clarified more as originally he was diagnosed incorrectly. Does feel doctors are working with current correct diagnosis and he will get better, but will "take some time." Needing to set some financial limits with another family member which she discussed today. Trying to live healthier and do more home cooking, in addition to some added exercise, which was supported in session today. With husband's medical progress looking better, patient reports feeling some improvement in her mood as well.  Uses humor in a lot of situations to help her cope.  Seems to have more hope about his illness since they have redefined it but knows there will still be limits as to his improvement.  Interventions: Cognitive Behavioral Therapy and  Ego-Supportive  Long-term goal: Reduce overall level, frequency, and intensity of the anxiety so that daily functioning is not impaired. Short-term goal: Describe current and past experiences with specific fears, prominent worries, and anxiety symptoms including their impact on functioning and attempts to resolve it.  Strategies: Identify, challenge, and replace fearful/anxious self-talk with positive, realistic, and empowering self-talk    Diagnosis:   ICD-10-CM   1. Generalized anxiety disorder  F41.1      Plan:  Patient continues to actively participate in sessions working more on her anxiety particularly related to her husband's illness and the uncertainty/unpredictability of symptoms and progression of illness.  She does remain motivated and continues to work with goal-directed behaviors that supports her moving forward in a healthier direction.  Goal review and progress/challenges noted with patient.  Next appointment within 4 weeks.   Mathis Fare, LCSW

## 2023-07-21 DIAGNOSIS — F419 Anxiety disorder, unspecified: Secondary | ICD-10-CM | POA: Diagnosis not present

## 2023-07-21 DIAGNOSIS — E1169 Type 2 diabetes mellitus with other specified complication: Secondary | ICD-10-CM | POA: Diagnosis not present

## 2023-08-22 ENCOUNTER — Encounter: Payer: Self-pay | Admitting: Adult Health

## 2023-08-22 ENCOUNTER — Ambulatory Visit (INDEPENDENT_AMBULATORY_CARE_PROVIDER_SITE_OTHER): Payer: BC Managed Care – PPO | Admitting: Adult Health

## 2023-08-22 DIAGNOSIS — F411 Generalized anxiety disorder: Secondary | ICD-10-CM

## 2023-08-22 MED ORDER — LORAZEPAM 0.5 MG PO TABS: ORAL_TABLET | ORAL | 2 refills | Status: DC

## 2023-08-22 NOTE — Progress Notes (Signed)
Cicley Dennis [id number] [birth year] 52 y.o.  Subjective:   Patient ID:  Ashlee Dennis is a 52 y.o. (DOB 02-26-1971) female.  Chief Complaint: No chief complaint on file.   HPI Ashlee Dennis presents to the office today for follow-up of GAD.  Describes mood today as "ok". Pleasant. Decreased tearfulness. Mood symptoms - reports decreased depression, anxiety, and irritability - "a little bit". Denies panic attacks. Denies worry, rumination, and over thinking. Mood has been consistent. Stating "I feel like I'm doing ok". Feels like medications are helpful. Stable interest and motivation. Taking medications as prescribed.  Energy levels improved. Active, has a regular exercise routine. Enjoys some usual interests and activities. Married. Lives with husband - 2 cats. Family local. Spending time with family. Appetite adequate. Weight gain - 157 pounds. Sleeps well most nights. Averages 6 to 7 hours. Focus and concentration stable. 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 Completing tasks. Managing aspects of household. 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  Denies SI or HI.  Denies AH or VH. Denies self harm. Denies substance use.  Previous medication trials:  Lorazepam, Lexapro   PHQ2-9    Flowsheet Row Office Visit from 02/25/2017 in Primary Care at [facility] Visit from 05/05/2016 in Primary Care at [facility] Visit from 08/19/2015 in Primary Care at [facility] Total Score 0 0 0        Review of Systems:  Review of Systems  Musculoskeletal:  Negative for gait problem.  Neurological:  Negative for tremors.  Psychiatric/Behavioral:         Please refer to HPI    Medications: I have reviewed the patient's current medications.  Current Outpatient Medications  Medication Sig Dispense Refill   escitalopram (LEXAPRO) 10 MG tablet Take 1 tablet (10 mg total) by mouth daily. 90 tablet 3   fexofenadine (ALLEGRA) 180 MG tablet Take 180 mg by mouth daily.     fluticasone (FLONASE) 50 MCG/ACT nasal spray as  needed.  3   glimepiride (AMARYL) 2 MG tablet Take 1.5 tablets (3 mg total) by mouth daily with breakfast. 135 tablet 3   LORazepam (ATIVAN) 0.5 MG tablet Take one tablet four times daily for increased anxiety. 120 tablet 2   losartan (COZAAR) 50 MG tablet Take 1 tablet by mouth daily.     montelukast (SINGULAIR) 10 MG tablet Take 10 mg by mouth daily.     pantoprazole (PROTONIX) 40 MG tablet Take 1 tablet by mouth daily.  1   sitaGLIPtin (JANUVIA) 100 MG tablet Take 1 tablet (100 mg total) by mouth daily. 90 tablet 3   SUMAtriptan (IMITREX) 100 MG tablet Take 1 tablet (100 mg total) by mouth once as needed for up to 1 dose for migraine. May repeat in 2 hours if headache persists or recurs. 10 tablet 6   SYNTHROID 75 MCG tablet Take 1 tablet (75 mcg total) by mouth daily before breakfast. 90 tablet 3   No current facility-administered medications for this visit.    Medication Side Effects: None  Allergies:  Allergies  Allergen Reactions   Asa [Aspirin] Other (See Comments)    GERD   Aspartame And Phenylalanine     Joint aches   Chocolate Flavor     Other reaction(s): excessive belching   Codeine Hives   Dextroamphetamine Hives   Diclofenac Sodium Hives    Other reaction(s): hives   Erythromycin Hives   Metformin And Related Hives   Mint Chocolate Chip Flavor    Other     Splenda,  Stevia, Truvia   Oxycodone Hives   Oxycodone-Acetaminophen     Other reaction(s): vomiting   Penicillins Hives   Pennsaid [Diclofenac Sodium] Hives   Semaglutide Nausea And Vomiting   Empagliflozin Nausea And Vomiting    Other reaction(s): diarrhea    Past Medical History:  Diagnosis Date   Allergy    Diabetes mellitus without complication (HCC)    GERD (gastroesophageal reflux disease)    Migraine    Thyroid disease     Past Medical History, Surgical history, Social history, and Family history were reviewed and updated as appropriate.   Please see review of systems for further details  on the patient's review from today.   Objective:   Physical Exam:  There were no vitals taken for this visit.  Physical Exam Constitutional:      General: She is not in acute distress. Musculoskeletal:        General: No deformity.  Neurological:     Mental Status: She is alert and oriented to person, place, and time.     Coordination: Coordination normal.  Psychiatric:        Attention and Perception: Attention and perception normal. She does not perceive auditory or visual hallucinations.        Mood and Affect: Mood normal. Mood is not anxious or depressed. Affect is not labile, blunt, angry or inappropriate.        Speech: Speech normal.        Behavior: Behavior normal.        Thought Content: Thought content normal. Thought content is not paranoid or delusional. Thought content does not include homicidal or suicidal ideation. Thought content does not include homicidal or suicidal plan.        Cognition and Memory: Cognition and memory normal.        Judgment: Judgment normal.     Comments: Insight intact     Lab Review:     Component Value Date/Time   NA 139 10/18/2022 0851   K 3.8 10/18/2022 0851   CL 102 10/18/2022 0851   CO2 30 10/18/2022 0851   GLUCOSE 235 (H) 10/18/2022 0851   BUN 16 10/18/2022 0851   CREATININE 0.66 10/18/2022 0851   CALCIUM 9.3 10/18/2022 0851   PROT 7.4 11/16/2017 1523   ALBUMIN 4.4 11/16/2017 1523   AST 26 11/16/2017 1523   ALT 21 11/16/2017 1523   ALKPHOS 50 11/16/2017 1523   BILITOT 0.5 11/16/2017 1523   GFRNONAA >60 11/16/2017 1523   GFRAA >60 11/16/2017 1523       Component Value Date/Time   WBC 14.6 (H) 11/16/2017 1523   RBC 5.00 11/16/2017 1523   HGB 15.0 11/16/2017 1532   HCT 44.0 11/16/2017 1532   PLT 289 11/16/2017 1523   MCV 85.8 11/16/2017 1523   MCH 29.6 11/16/2017 1523   MCHC 34.5 11/16/2017 1523   RDW 12.0 11/16/2017 1523   LYMPHSABS 1.4 11/16/2017 1523   MONOABS 0.7 11/16/2017 1523   EOSABS 0.0 11/16/2017 1523    BASOSABS 0.0 11/16/2017 1523    No results found for: "POCLITH", "LITHIUM"   No results found for: "PHENYTOIN", "PHENOBARB", "VALPROATE", "CBMZ"   .res Assessment: Plan:    Plan:  PDMP reviewed  Lorazepam 0.5mg  - taking 3 tablets daily for anxiety - has a 4th dose  as needed. Lexapro 10mg    RTC 3 months  Patient advised to contact office with any questions, adverse effects, or acute worsening in signs and symptoms.    Discussed  potential benefits, risk, and side effects of benzodiazepines to include potential risk of tolerance and dependence, as well as possible drowsiness.  Advised patient not to drive if experiencing drowsiness and to take lowest possible effective dose to minimize risk of dependence and tolerance.   There are no diagnoses linked to this encounter.   Please see After Visit Summary for patient specific instructions.  Future Appointments  Date Time Provider Department Center  08/22/2023 10:00 AM Emonnie Cannady, Thereasa Solo, NP [facility] None  09/05/2023 11:00 AM Mathis Fare, LCSW [facility] None  10/31/2023  9:10 AM Shamleffer, Konrad Dolores, MD [facility] None    No orders of the defined types were placed in this encounter.   -------------------------------

## 2023-08-25 ENCOUNTER — Ambulatory Visit (INDEPENDENT_AMBULATORY_CARE_PROVIDER_SITE_OTHER): Payer: BC Managed Care – PPO

## 2023-08-25 ENCOUNTER — Encounter: Payer: Self-pay | Admitting: Podiatry

## 2023-08-25 ENCOUNTER — Ambulatory Visit: Payer: BC Managed Care – PPO | Admitting: Podiatry

## 2023-08-25 DIAGNOSIS — M779 Enthesopathy, unspecified: Secondary | ICD-10-CM

## 2023-08-25 DIAGNOSIS — M7671 Peroneal tendinitis, right leg: Secondary | ICD-10-CM | POA: Diagnosis not present

## 2023-08-25 NOTE — Progress Notes (Signed)
Subjective:   Patient ID: Ashlee Dennis, female   DOB: 52 y.o.   MRN: [id number]   HPI Patient presents with caregiver with pain in the outside of the right foot and states it has been hurting her for a while.  States that she had a pedicure and started after that and presents with caregiver   ROS      Objective:  Physical Exam  Neurovascular status intact with inflammation around the base of the fifth metatarsal right fluid buildup no indication of tendon damage or dysfunction     Assessment:  Peroneal tendinitis right with inflammation     Plan:  H&P reviewed and I have recommended injection she does not want this so I went ahead and put brace on the left lateral side the foot will use aggressive ice and topical diclofenac and will be seen back as needed.  May require injection in future if symptoms do not get better  X-rays indicate minimal reactivity around the base of the fifth metatarsal no indication fracture or other pathology

## 2023-08-30 ENCOUNTER — Ambulatory Visit: Payer: BC Managed Care – PPO | Admitting: Internal Medicine

## 2023-09-05 ENCOUNTER — Ambulatory Visit: Payer: BC Managed Care – PPO | Admitting: Psychiatry

## 2023-09-05 DIAGNOSIS — F411 Generalized anxiety disorder: Secondary | ICD-10-CM

## 2023-09-05 NOTE — Progress Notes (Signed)
      [facility] Counselor/Therapist Progress Note  Patient ID: Ashlee Dennis, MRN: [id number],    Date: 09/05/2023  Time Spent: 53 minutes   Treatment Type: Individual Therapy  Reported Symptoms: anxiety, some depression   Mental Status Exam:  Appearance:   Casual and Neat     Behavior:  Appropriate, Sharing, and Motivated  Motor:  Normal  Speech/Language:   Clear and Coherent  Affect:  Anxious, some depression  Mood:  anxious and some depression  Thought process:  goal directed  Thought content:    WNL  Sensory/Perceptual disturbances:    WNL  Orientation:  oriented to person, place, time/date, situation, day of week, month of year, year, and stated date of Nov. 25, 2024  Attention:  Good  Concentration:  Good  Memory:  WNL  Fund of knowledge:   Good  Insight:    Good and Fair  Judgment:   Good  Impulse Control:  Good and Fair   Risk Assessment: Danger to Self:  No Self-injurious Behavior: No Danger to Others: No Duty to Warn:no Physical Aggression / Violence:No  Access to Firearms a concern: No  Gang Involvement:No   Subjective:  Patient today participating well in session and reporting her main symptom to be anxiety with it mostly being related to husband's ongoing illness and she is responsible for a lot of his care but does have some help predictably throughout the week. No recent declining and "holding his own." Reports getting together with brother's family at Thanksgiving and looking forward to it. Processing further her anxiety and concern about husband's health and things are happening recently that seem to be helping her husband physically and helping patient emotionally. Continues to use humor as she copes with multiple challenges. Still working on reducing certain fears and being more open in talking about painful feelings and situations, as we discussed more in session today.  Tries to be out and around people more which can help at times.  Continues to try and  be healthier especially in her cooking and some exercise as she is able.  Interventions: Cognitive Behavioral Therapy and Ego-Supportive  Long-term goal: Reduce overall level, frequency, and intensity of the anxiety so that daily functioning is not impaired. Short-term goal: Describe current and past experiences with specific fears, prominent worries, and anxiety symptoms including their impact on functioning and attempts to resolve it.  Strategies: Identify, challenge, and replace fearful/anxious self-talk with positive, realistic, and empowering self-talk     Diagnosis:   ICD-10-CM   1. Generalized anxiety disorder  F41.1      Plan:  Patient remaining goal focused and participates well in sessions as she works further on her anxiety mostly related to her husband's illness and so much unpredictability/uncertainty of his symptoms and the progression of his illness going forward.  She continues to work with goal-directed behaviors and remains motivated.  She notes progress and needs to continue working with goal-directed behaviors to move forward in a more hopeful and healthier direction.  Goal review and progress/challenges noted with patient.  Next appointment within 4 weeks.   Mathis Fare, LCSW

## 2023-10-20 DIAGNOSIS — Z1231 Encounter for screening mammogram for malignant neoplasm of breast: Secondary | ICD-10-CM | POA: Diagnosis not present

## 2023-10-31 ENCOUNTER — Ambulatory Visit: Payer: BC Managed Care – PPO | Admitting: Internal Medicine

## 2023-10-31 ENCOUNTER — Encounter: Payer: Self-pay | Admitting: Internal Medicine

## 2023-10-31 VITALS — BP 120/80 | HR 97 | Resp 16 | Ht 64.0 in | Wt 163.6 lb

## 2023-10-31 DIAGNOSIS — E039 Hypothyroidism, unspecified: Secondary | ICD-10-CM

## 2023-10-31 DIAGNOSIS — E1165 Type 2 diabetes mellitus with hyperglycemia: Secondary | ICD-10-CM | POA: Diagnosis not present

## 2023-10-31 DIAGNOSIS — Z7984 Long term (current) use of oral hypoglycemic drugs: Secondary | ICD-10-CM | POA: Diagnosis not present

## 2023-10-31 LAB — POCT GLYCOSYLATED HEMOGLOBIN (HGB A1C): Hemoglobin A1C: 7.3 % — AB (ref 4.0–5.6)

## 2023-10-31 MED ORDER — SITAGLIPTIN PHOSPHATE 100 MG PO TABS: 100.0000 mg | ORAL_TABLET | Freq: Every day | ORAL | 3 refills | Status: AC

## 2023-10-31 MED ORDER — SYNTHROID 75 MCG PO TABS: 75.0000 ug | ORAL_TABLET | Freq: Every day | ORAL | 3 refills | Status: AC

## 2023-10-31 MED ORDER — GLIMEPIRIDE 4 MG PO TABS: 6.0000 mg | ORAL_TABLET | Freq: Every day | ORAL | 3 refills | Status: AC

## 2023-10-31 NOTE — Patient Instructions (Signed)
Change  Glimepiride 4 mg , 1.5 tablets before Breakfast  Take Januvia 100 mg, 1 tablet before Breakfast      HOW TO TREAT LOW BLOOD SUGARS (Blood sugar LESS THAN 70 MG/DL) Please follow the RULE OF 15 for the treatment of hypoglycemia treatment (when your (blood sugars are less than 70 mg/dL)   STEP 1: Take 15 grams of carbohydrates when your blood sugar is low, which includes:  3-4 GLUCOSE TABS  OR 3-4 OZ OF JUICE OR REGULAR SODA OR ONE TUBE OF GLUCOSE GEL    STEP 2: RECHECK blood sugar in 15 MINUTES STEP 3: If your blood sugar is still low at the 15 minute recheck --> then, go back to STEP 1 and treat AGAIN with another 15 grams of carbohydrates.

## 2023-10-31 NOTE — Progress Notes (Signed)
Name: Ashlee Dennis  MRN/ DOB: [id number], 1971-02-25   Age/ Sex: 53 y.o., female    PCP: Renford Dills, MD   Reason for Endocrinology Evaluation: Type 2 Diabetes Mellitus     Date of Initial Endocrinology Visit: 10/18/2022    PATIENT IDENTIFIER: [name] is a 53 y.o. female with a past medical history of DM, HTN, migraine, generalized anxiety disorder, hypothyroid and OSA. The patient presented for initial endocrinology clinic visit on 10/18/2022 for consultative assistance with her diabetes management.    HPI: Ashlee Dennis was    Diagnosed with DM  at age 23 Prior Medications tried/Intolerance: jardiance and metformin - hives , rybelsus severe vomiting  Hemoglobin A1c has ranged from 6.2% in 2012, peaking at 7.0% in 2024.   On her initial visit to our clinic she had an A1c of 7.0%, we continue Januvia and glimepiride   THYROID HISTORY: Patient has been on chronic LT-for replacement.  She has had chronic thyromegaly with an ultrasound in 2014 not revealing any thyroid nodules.  Mother and two sister with thyroid disease    SUBJECTIVE:   During the last visit (02/25/2023): A1c 7.0%  Today (10/31/23): Ashlee Dennis is here for follow-up on diabetes management and hypothyroidism.  She checks her blood sugars multiple times daily through [employer]. The patient has not had hypoglycemic episodes   Patient continues to follow-up with podiatry Denies constipation or diarrhea  Denies palpitations  Denies tremors  No biotin     HOME DIABETES REGIMEN: Glimepiride 2 mg daily-taking 2 tablets daily Januvia 100 mg daily  Synthroid 75 mcg daily     Statin: No ACE-I/ARB: Yes    CONTINUOUS GLUCOSE MONITORING RECORD INTERPRETATION    Dates of Recording: 1/7 - 10/31/2023  Sensor description: Freestyle libre  Results statistics:   CGM use % of time 94  Average and SD 190/20.4  Time in range     45   %  % Time Above 180 47  % Time above 250 8  % Time Below target 0    Glycemic patterns summary: BGs are optimal overnight and trend upwards during the day  Hyperglycemic episodes postprandial  Hypoglycemic episodes occurred N/A  Overnight periods: Trends down    DIABETIC COMPLICATIONS: Microvascular complications:   Denies: CKD Last eye exam: Completed 01/08/2022  Macrovascular complications:   Denies: CAD, PVD, CVA   PAST HISTORY: Past Medical History:  Past Medical History:  Diagnosis Date   Allergy    Diabetes mellitus without complication (HCC)    GERD (gastroesophageal reflux disease)    Migraine    Thyroid disease    Past Surgical History:  Past Surgical History:  Procedure Laterality Date   ABDOMINAL HYSTERECTOMY     BREAST SURGERY     Cancer   KNEE ARTHROSCOPY     Teeth Implants     x 2    Social History:  reports that she has never smoked. She has never used smokeless tobacco. She reports that she does not drink alcohol and does not use drugs. Family History:  Family History  Problem Relation Age of Onset   Cancer Brother 47       renal cell carcinoma   Migraines Mother    Seizures Mother    Skin cancer Father    Thyroid disease Sister    Thyroid disease Sister    Colon cancer Neg Hx    Stomach cancer Neg Hx    Rectal cancer Neg Hx    Esophageal  cancer Neg Hx    Liver cancer Neg Hx      HOME MEDICATIONS: Allergies as of 10/31/2023       Reactions   Asa [aspirin] Other (See Comments)   GERD   Aspartame And Phenylalanine    Joint aches   Chocolate Flavoring Agent (non-screening)    Other reaction(s): excessive belching   Codeine Hives   Dextroamphetamine Hives   Diclofenac Sodium Hives   Other reaction(s): hives   Erythromycin Hives   Metformin And Related Hives   Mint Chocolate Chip Flavoring (non-screening)    Other    Splenda, Stevia, Truvia   Oxycodone Hives   Oxycodone-acetaminophen    Other reaction(s): vomiting   Penicillins Hives   Pennsaid [diclofenac Sodium] Hives   Semaglutide  Nausea And Vomiting   Empagliflozin Nausea And Vomiting   Other reaction(s): diarrhea        Medication List        Accurate as of October 31, 2023  9:23 AM. If you have any questions, ask your nurse or doctor.          escitalopram 10 MG tablet Commonly known as: LEXAPRO Take 1 tablet (10 mg total) by mouth daily.   fexofenadine 180 MG tablet Commonly known as: ALLEGRA Take 180 mg by mouth daily.   fluticasone 50 MCG/ACT nasal spray Commonly known as: FLONASE as needed.   FreeStyle [employer] by Does not apply route.   glimepiride 4 MG tablet Commonly known as: AMARYL Take 1.5 tablets (6 mg total) by mouth daily before breakfast. What changed:  medication strength how much to take when to take this Changed by: Johnney Ou Johne Buckle   LORazepam 0.5 MG tablet Commonly known as: ATIVAN Take one tablet four times daily for increased anxiety.   losartan 50 MG tablet Commonly known as: COZAAR Take 1 tablet by mouth daily.   montelukast 10 MG tablet Commonly known as: SINGULAIR Take 10 mg by mouth daily.   pantoprazole 40 MG tablet Commonly known as: PROTONIX Take 1 tablet by mouth daily.   sitaGLIPtin 100 MG tablet Commonly known as: Januvia Take 1 tablet (100 mg total) by mouth daily.   SUMAtriptan 100 MG tablet Commonly known as: IMITREX Take 1 tablet (100 mg total) by mouth once as needed for up to 1 dose for migraine. May repeat in 2 hours if headache persists or recurs.   Synthroid 75 MCG tablet Generic drug: levothyroxine Take 1 tablet (75 mcg total) by mouth daily before breakfast.         ALLERGIES: Allergies  Allergen Reactions   Asa [Aspirin] Other (See Comments)    GERD   Aspartame And Phenylalanine     Joint aches   Chocolate Flavoring Agent (Non-Screening)     Other reaction(s): excessive belching   Codeine Hives   Dextroamphetamine Hives   Diclofenac Sodium Hives    Other reaction(s): hives   Erythromycin Hives    Metformin And Related Hives   Mint Chocolate Chip Flavoring (Non-Screening)    Other     Splenda, Stevia, Truvia   Oxycodone Hives   Oxycodone-Acetaminophen     Other reaction(s): vomiting   Penicillins Hives   Pennsaid [Diclofenac Sodium] Hives   Semaglutide Nausea And Vomiting   Empagliflozin Nausea And Vomiting    Other reaction(s): diarrhea     REVIEW OF SYSTEMS: A comprehensive ROS was conducted with the patient and is negative except as per HPI     OBJECTIVE:   VITAL  SIGNS: BP 120/80   Pulse 97   Resp 16   Ht 5\' 4"  (1.626 m)   Wt 163 lb 9.6 oz (74.2 kg)   SpO2 96%   BMI 28.08 kg/m    PHYSICAL EXAM:  General: Pt appears well and is in NAD  Neck: General: Supple without adenopathy or carotid bruits. Thyroid: Thyroid size normal.  No goiter or nodules appreciated.   Lungs: Clear with good BS bilat   Heart: RRR   Extremities:  Lower extremities - No pretibial edema.  Neuro: MS is good with appropriate affect, pt is alert and Ox3    DM foot exam: 08/25/2023 per podiatry    DATA REVIEWED:  05/27/2023 BUN/Cr 12/0.660 GFR 106 LDL 89 Tg 144 TSH 0.840 A1c 7.7%   Old records , labs and images have been reviewed.    ASSESSMENT / PLAN / RECOMMENDATIONS:   1) Type 2 Diabetes Mellitus, Sub-optimally controlled, Without complications - Most recent A1c of 7.6 %. Goal A1c < 7.0 %.     -Patient has been noted with hyperglycemia -Patient with reported intolerance to metformin, Jardiance, and Rybelsus -We discussed increasing glimepiride versus switching to glipizide.  Patient would like to remain on glimepiride, will increase the dose as below    MEDICATIONS: Change  glimepiride 4 mg, 1.5 tablets before Breakfast Continue Januvia 100 mg daily  EDUCATION / INSTRUCTIONS BG monitoring instructions: Patient is instructed to check her blood sugars 3 times a day before meals Call [facility] if: BG persistently < 70  I reviewed the Rule of 15 for  the treatment of hypoglycemia in detail with the patient. Literature supplied.   2) Diabetic complications:  Eye: Does not have known diabetic retinopathy.  Neuro/ Feet: Does not have known diabetic peripheral neuropathy. Renal: Patient does not have known baseline CKD. She is  on an ACEI/ARB at present.  3) Hypothyroidism:  -Patient is clinically euthyroid -Previous thyroid ultrasound did not reveal any thyroid nodules -TSH is within normal range at PCPs office -No change at this time  Medication Continue Synthroid 75 mcg daily  Follow-up in 4 months    Signed electronically by: Lyndle Herrlich, MD  [facility]  [facility] [address] [place], [place] [address] Phone: [phone] FAX: [phone]   CC: Renford Dills, MD [address] [place] [address] Phone: [phone]  Fax: [phone]    Return to Endocrinology clinic as below: Future Appointments  Date Time Provider Department Center  11/28/2023 10:30 AM Mozingo, Thereasa Solo, NP [facility] None  11/28/2023 12:00 PM Mathis Fare, LCSW [facility] None

## 2023-11-01 ENCOUNTER — Encounter: Payer: Self-pay | Admitting: Internal Medicine

## 2023-11-01 LAB — MICROALBUMIN / CREATININE URINE RATIO
Creatinine, Urine: 153 mg/dL (ref 20–275)
Microalb Creat Ratio: 5 mg/g{creat} (ref <30)
Microalb, Ur: 0.7 mg/dL

## 2023-11-03 ENCOUNTER — Encounter: Payer: Self-pay | Admitting: Internal Medicine

## 2023-11-04 DIAGNOSIS — H938X3 Other specified disorders of ear, bilateral: Secondary | ICD-10-CM | POA: Diagnosis not present

## 2023-11-04 DIAGNOSIS — H903 Sensorineural hearing loss, bilateral: Secondary | ICD-10-CM | POA: Diagnosis not present

## 2023-11-04 DIAGNOSIS — Q8789 Other specified congenital malformation syndromes, not elsewhere classified: Secondary | ICD-10-CM | POA: Diagnosis not present

## 2023-11-04 DIAGNOSIS — Z8669 Personal history of other diseases of the nervous system and sense organs: Secondary | ICD-10-CM | POA: Diagnosis not present

## 2023-11-04 DIAGNOSIS — Z01118 Encounter for examination of ears and hearing with other abnormal findings: Secondary | ICD-10-CM | POA: Diagnosis not present

## 2023-11-04 DIAGNOSIS — H6123 Impacted cerumen, bilateral: Secondary | ICD-10-CM | POA: Diagnosis not present

## 2023-11-07 DIAGNOSIS — F419 Anxiety disorder, unspecified: Secondary | ICD-10-CM | POA: Diagnosis not present

## 2023-11-07 DIAGNOSIS — E1169 Type 2 diabetes mellitus with other specified complication: Secondary | ICD-10-CM | POA: Diagnosis not present

## 2023-11-07 DIAGNOSIS — E663 Overweight: Secondary | ICD-10-CM | POA: Diagnosis not present

## 2023-11-09 DIAGNOSIS — H6123 Impacted cerumen, bilateral: Secondary | ICD-10-CM | POA: Diagnosis not present

## 2023-11-09 DIAGNOSIS — Q8789 Other specified congenital malformation syndromes, not elsewhere classified: Secondary | ICD-10-CM | POA: Diagnosis not present

## 2023-11-09 DIAGNOSIS — E049 Nontoxic goiter, unspecified: Secondary | ICD-10-CM | POA: Diagnosis not present

## 2023-11-18 DIAGNOSIS — E049 Nontoxic goiter, unspecified: Secondary | ICD-10-CM | POA: Diagnosis not present

## 2023-11-22 DIAGNOSIS — M79641 Pain in right hand: Secondary | ICD-10-CM | POA: Diagnosis not present

## 2023-11-22 DIAGNOSIS — S63591A Other specified sprain of right wrist, initial encounter: Secondary | ICD-10-CM | POA: Diagnosis not present

## 2023-11-28 ENCOUNTER — Encounter: Payer: Self-pay | Admitting: Adult Health

## 2023-11-28 ENCOUNTER — Ambulatory Visit: Payer: BC Managed Care – PPO | Admitting: Psychiatry

## 2023-11-28 ENCOUNTER — Ambulatory Visit: Payer: BC Managed Care – PPO | Admitting: Adult Health

## 2023-11-28 DIAGNOSIS — F411 Generalized anxiety disorder: Secondary | ICD-10-CM

## 2023-11-28 MED ORDER — LORAZEPAM 0.5 MG PO TABS: ORAL_TABLET | ORAL | 2 refills | Status: DC

## 2023-11-28 MED ORDER — ESCITALOPRAM OXALATE 10 MG PO TABS: 10.0000 mg | ORAL_TABLET | Freq: Every day | ORAL | 3 refills | Status: DC

## 2023-11-28 NOTE — Progress Notes (Signed)
 Berlin Viereck [id number] [birth year] 53 y.o.  Subjective:   Patient ID:  Ashlee Dennis is a 53 y.o. (DOB 30-Sep-1971) female.  Chief Complaint: No chief complaint on file.   HPI Ashlee Dennis presents to the office today for follow-up of GAD.  Describes mood today as "ok". Pleasant. Decreased tearfulness. Mood symptoms - reports decreased depression, anxiety, and irritability. Reports stable interest and motivation. Denies panic attacks. Denies worry, rumination and over thinking. Mood is consistent. Stating "I feel like I'm doing good". Taking medications as prescribed.  Energy levels improved. Active, has a regular exercise routine. Enjoys some usual interests and activities. Married. Lives with husband - 2 cats. Family local. Spending time with family. Appetite adequate. Weight gain - 157 to 164 pounds. Sleeps well most nights. Averages 6 to 7 hours. Focus and concentration stable. 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 Completing tasks. Managing aspects of household. 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  Denies SI or HI.  Denies AH or VH. Denies self harm. Denies substance use.  Previous medication trials:  Lorazepam, Lexapro   PHQ2-9    Flowsheet Row Office Visit from 02/25/2017 in Primary Care at [facility] Visit from 05/05/2016 in Primary Care at [facility] Visit from 08/19/2015 in Primary Care at [facility] Total Score 0 0 0        Review of Systems:  Review of Systems  Musculoskeletal:  Negative for gait problem.  Neurological:  Negative for tremors.  Psychiatric/Behavioral:         Please refer to HPI    Medications: I have reviewed the patient's current medications.  Current Outpatient Medications  Medication Sig Dispense Refill   Continuous Glucose Receiver (FREESTYLE LIBRE READER) DEVI by Does not apply route.     escitalopram (LEXAPRO) 10 MG tablet Take 1 tablet (10 mg total) by mouth daily. 90 tablet 3   fexofenadine (ALLEGRA) 180 MG tablet Take 180 mg by mouth daily.      fluticasone (FLONASE) 50 MCG/ACT nasal spray as needed.  3   glimepiride (AMARYL) 4 MG tablet Take 1.5 tablets (6 mg total) by mouth daily before breakfast. 135 tablet 3   LORazepam (ATIVAN) 0.5 MG tablet Take one tablet four times daily for increased anxiety. 120 tablet 2   losartan (COZAAR) 50 MG tablet Take 1 tablet by mouth daily.     montelukast (SINGULAIR) 10 MG tablet Take 10 mg by mouth daily.     pantoprazole (PROTONIX) 40 MG tablet Take 1 tablet by mouth daily.  1   sitaGLIPtin (JANUVIA) 100 MG tablet Take 1 tablet (100 mg total) by mouth daily. 90 tablet 3   SUMAtriptan (IMITREX) 100 MG tablet Take 1 tablet (100 mg total) by mouth once as needed for up to 1 dose for migraine. May repeat in 2 hours if headache persists or recurs. 10 tablet 6   SYNTHROID 75 MCG tablet Take 1 tablet (75 mcg total) by mouth daily before breakfast. 90 tablet 3   No current facility-administered medications for this visit.    Medication Side Effects: None  Allergies:  Allergies  Allergen Reactions   Asa [Aspirin] Other (See Comments)    GERD   Aspartame And Phenylalanine     Joint aches   Chocolate Flavoring Agent (Non-Screening)     Other reaction(s): excessive belching   Codeine Hives   Dextroamphetamine Hives   Diclofenac Sodium Hives    Other reaction(s): hives   Erythromycin Hives   Metformin And Related Hives   Mint Chocolate  Chip Flavoring (Non-Screening)    Other     Splenda, Stevia, Truvia   Oxycodone Hives   Oxycodone-Acetaminophen     Other reaction(s): vomiting   Penicillins Hives   Pennsaid [Diclofenac Sodium] Hives   Semaglutide Nausea And Vomiting   Empagliflozin Nausea And Vomiting    Other reaction(s): diarrhea    Past Medical History:  Diagnosis Date   Allergy    Diabetes mellitus without complication (HCC)    GERD (gastroesophageal reflux disease)    Migraine    Thyroid disease     Past Medical History, Surgical history, Social history, and Family history  were reviewed and updated as appropriate.   Please see review of systems for further details on the patient's review from today.   Objective:   Physical Exam:  There were no vitals taken for this visit.  Physical Exam Constitutional:      General: She is not in acute distress. Musculoskeletal:        General: No deformity.  Neurological:     Mental Status: She is alert and oriented to person, place, and time.     Coordination: Coordination normal.  Psychiatric:        Attention and Perception: Attention and perception normal. She does not perceive auditory or visual hallucinations.        Mood and Affect: Affect is not labile, blunt, angry or inappropriate.        Speech: Speech normal.        Behavior: Behavior normal.        Thought Content: Thought content normal. Thought content is not paranoid or delusional. Thought content does not include homicidal or suicidal ideation. Thought content does not include homicidal or suicidal plan.        Cognition and Memory: Cognition and memory normal.        Judgment: Judgment normal.     Comments: Insight intact     Lab Review:     Component Value Date/Time   NA 139 10/18/2022 0851   K 3.8 10/18/2022 0851   CL 102 10/18/2022 0851   CO2 30 10/18/2022 0851   GLUCOSE 235 (H) 10/18/2022 0851   BUN 16 10/18/2022 0851   CREATININE 0.66 10/18/2022 0851   CALCIUM 9.3 10/18/2022 0851   PROT 7.4 11/16/2017 1523   ALBUMIN 4.4 11/16/2017 1523   AST 26 11/16/2017 1523   ALT 21 11/16/2017 1523   ALKPHOS 50 11/16/2017 1523   BILITOT 0.5 11/16/2017 1523   GFRNONAA >60 11/16/2017 1523   GFRAA >60 11/16/2017 1523       Component Value Date/Time   WBC 14.6 (H) 11/16/2017 1523   RBC 5.00 11/16/2017 1523   HGB 15.0 11/16/2017 1532   HCT 44.0 11/16/2017 1532   PLT 289 11/16/2017 1523   MCV 85.8 11/16/2017 1523   MCH 29.6 11/16/2017 1523   MCHC 34.5 11/16/2017 1523   RDW 12.0 11/16/2017 1523   LYMPHSABS 1.4 11/16/2017 1523   MONOABS  0.7 11/16/2017 1523   EOSABS 0.0 11/16/2017 1523   BASOSABS 0.0 11/16/2017 1523    No results found for: "POCLITH", "LITHIUM"   No results found for: "PHENYTOIN", "PHENOBARB", "VALPROATE", "CBMZ"   .res Assessment: Plan:    Plan:  PDMP reviewed  Lorazepam 0.5mg  - taking 3 tablets daily for anxiety - has a 4th dose as needed. Lexapro 10mg    RTC 3 months  Patient advised to contact office with any questions, adverse effects, or acute worsening in signs and symptoms.  Discussed potential benefits, risk, and side effects of benzodiazepines to include potential risk of tolerance and dependence, as well as possible drowsiness.  Advised patient not to drive if experiencing drowsiness and to take lowest possible effective dose to minimize risk of dependence and tolerance.   There are no diagnoses linked to this encounter.   Please see After Visit Summary for patient specific instructions.  Future Appointments  Date Time Provider Department Center  11/28/2023 12:00 PM Mathis Fare, LCSW [facility] None  02/28/2024  9:50 AM Shamleffer, Konrad Dolores, MD [facility] None    No orders of the defined types were placed in this encounter.   -------------------------------
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
## 2023-12-02 DIAGNOSIS — H903 Sensorineural hearing loss, bilateral: Secondary | ICD-10-CM | POA: Diagnosis not present

## 2023-12-05 DIAGNOSIS — E049 Nontoxic goiter, unspecified: Secondary | ICD-10-CM | POA: Diagnosis not present

## 2023-12-05 DIAGNOSIS — I10 Essential (primary) hypertension: Secondary | ICD-10-CM | POA: Diagnosis not present

## 2023-12-05 DIAGNOSIS — E1165 Type 2 diabetes mellitus with hyperglycemia: Secondary | ICD-10-CM | POA: Diagnosis not present

## 2023-12-05 DIAGNOSIS — E039 Hypothyroidism, unspecified: Secondary | ICD-10-CM | POA: Diagnosis not present

## 2023-12-06 DIAGNOSIS — M7542 Impingement syndrome of left shoulder: Secondary | ICD-10-CM | POA: Diagnosis not present

## 2023-12-09 DIAGNOSIS — M25512 Pain in left shoulder: Secondary | ICD-10-CM | POA: Diagnosis not present

## 2023-12-12 DIAGNOSIS — M25512 Pain in left shoulder: Secondary | ICD-10-CM | POA: Diagnosis not present

## 2023-12-19 DIAGNOSIS — M25512 Pain in left shoulder: Secondary | ICD-10-CM | POA: Diagnosis not present

## 2023-12-20 DIAGNOSIS — E1169 Type 2 diabetes mellitus with other specified complication: Secondary | ICD-10-CM | POA: Diagnosis not present

## 2023-12-20 DIAGNOSIS — E663 Overweight: Secondary | ICD-10-CM | POA: Diagnosis not present

## 2023-12-20 DIAGNOSIS — F419 Anxiety disorder, unspecified: Secondary | ICD-10-CM | POA: Diagnosis not present

## 2023-12-20 DIAGNOSIS — S63591A Other specified sprain of right wrist, initial encounter: Secondary | ICD-10-CM | POA: Diagnosis not present

## 2023-12-23 DIAGNOSIS — M25512 Pain in left shoulder: Secondary | ICD-10-CM | POA: Diagnosis not present

## 2023-12-26 DIAGNOSIS — M25512 Pain in left shoulder: Secondary | ICD-10-CM | POA: Diagnosis not present

## 2023-12-29 DIAGNOSIS — M25512 Pain in left shoulder: Secondary | ICD-10-CM | POA: Diagnosis not present

## 2024-01-03 DIAGNOSIS — I10 Essential (primary) hypertension: Secondary | ICD-10-CM | POA: Diagnosis not present

## 2024-01-03 DIAGNOSIS — E1165 Type 2 diabetes mellitus with hyperglycemia: Secondary | ICD-10-CM | POA: Diagnosis not present

## 2024-01-03 DIAGNOSIS — E039 Hypothyroidism, unspecified: Secondary | ICD-10-CM | POA: Diagnosis not present

## 2024-01-03 DIAGNOSIS — E7800 Pure hypercholesterolemia, unspecified: Secondary | ICD-10-CM | POA: Diagnosis not present

## 2024-01-03 DIAGNOSIS — M25512 Pain in left shoulder: Secondary | ICD-10-CM | POA: Diagnosis not present

## 2024-01-05 DIAGNOSIS — M25512 Pain in left shoulder: Secondary | ICD-10-CM | POA: Diagnosis not present

## 2024-01-09 DIAGNOSIS — M25512 Pain in left shoulder: Secondary | ICD-10-CM | POA: Diagnosis not present

## 2024-01-10 DIAGNOSIS — M25512 Pain in left shoulder: Secondary | ICD-10-CM | POA: Diagnosis not present

## 2024-01-16 DIAGNOSIS — M25512 Pain in left shoulder: Secondary | ICD-10-CM | POA: Diagnosis not present

## 2024-01-30 DIAGNOSIS — M25512 Pain in left shoulder: Secondary | ICD-10-CM | POA: Diagnosis not present

## 2024-01-30 DIAGNOSIS — M7502 Adhesive capsulitis of left shoulder: Secondary | ICD-10-CM | POA: Diagnosis not present

## 2024-01-31 DIAGNOSIS — Z6826 Body mass index (BMI) 26.0-26.9, adult: Secondary | ICD-10-CM | POA: Diagnosis not present

## 2024-01-31 DIAGNOSIS — F419 Anxiety disorder, unspecified: Secondary | ICD-10-CM | POA: Diagnosis not present

## 2024-01-31 DIAGNOSIS — E663 Overweight: Secondary | ICD-10-CM | POA: Diagnosis not present

## 2024-01-31 DIAGNOSIS — E1169 Type 2 diabetes mellitus with other specified complication: Secondary | ICD-10-CM | POA: Diagnosis not present

## 2024-02-01 DIAGNOSIS — M25512 Pain in left shoulder: Secondary | ICD-10-CM | POA: Diagnosis not present

## 2024-02-08 DIAGNOSIS — M25512 Pain in left shoulder: Secondary | ICD-10-CM | POA: Diagnosis not present

## 2024-02-13 DIAGNOSIS — M25512 Pain in left shoulder: Secondary | ICD-10-CM | POA: Diagnosis not present

## 2024-02-15 DIAGNOSIS — M25512 Pain in left shoulder: Secondary | ICD-10-CM | POA: Diagnosis not present

## 2024-02-23 DIAGNOSIS — M25512 Pain in left shoulder: Secondary | ICD-10-CM | POA: Diagnosis not present

## 2024-02-27 ENCOUNTER — Telehealth: Payer: BC Managed Care – PPO | Admitting: Adult Health

## 2024-02-27 ENCOUNTER — Encounter: Payer: Self-pay | Admitting: Adult Health

## 2024-02-27 DIAGNOSIS — F411 Generalized anxiety disorder: Secondary | ICD-10-CM

## 2024-02-27 MED ORDER — LORAZEPAM 0.5 MG PO TABS
ORAL_TABLET | ORAL | 2 refills | Status: DC
Start: 2024-02-27 — End: 2024-05-29

## 2024-02-27 NOTE — Progress Notes (Signed)
 Ashlee Dennis [id number] 1971-09-17 53 y.o.  Virtual Visit via Video Note  I connected with pt @ on 02/27/24 at 10:30 AM EDT by a video enabled telemedicine application and verified that I am speaking with the correct person using two identifiers.   I discussed the limitations of evaluation and management by telemedicine and the availability of in person appointments. The patient expressed understanding and agreed to proceed.  I discussed the assessment and treatment plan with the patient. The patient was provided an opportunity to ask questions and all were answered. The patient agreed with the plan and demonstrated an understanding of the instructions.   The patient was advised to call back or seek an in-person evaluation if the symptoms worsen or if the condition fails to improve as anticipated.  I provided 10 minutes of non-face-to-face time during this encounter.  The patient was located at home.  The provider was located at [facility].   Reagan Camera, NP   Subjective:   Patient ID:  Ashlee Dennis is a 53 y.o. (DOB 26-Dec-1970) female.  Chief Complaint: No chief complaint on file.   HPI Ashlee Dennis presents for follow-up of GAD.  Describes mood today as "ok". Pleasant. Decreased tearfulness. Mood symptoms - denies depression - "some sadness". Reports stable interest and motivation. Reports situational anxiety. Denies irritability. Denies panic attacks. Denies worry, rumination and over thinking. Reports mood is stable. Stating "I feel like I'm doing ok". Taking medications as prescribed.  Energy levels improved. Active, has a regular exercise routine. Enjoys some usual interests and activities. Married. Lives with husband - 2 cats. Family local. Spending time with family. Appetite adequate. Weight gain - 164 pounds. Sleeps well most nights. Averages 6 to 7 hours. Focus and concentration stable. 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 Completing tasks. Managing aspects of  household. 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  Denies SI or HI.  Denies AH or VH. Denies self harm. Denies substance use.  Previous medication trials:  Lorazepam , Lexapro    Review of Systems:  Review of Systems  Musculoskeletal:  Negative for gait problem.  Neurological:  Negative for tremors.  Psychiatric/Behavioral:         Please refer to HPI    Medications: I have reviewed the patient's current medications.  Current Outpatient Medications  Medication Sig Dispense Refill   Continuous Glucose Receiver (FREESTYLE LIBRE READER) DEVI by Does not apply route.     escitalopram  (LEXAPRO ) 10 MG tablet Take 1 tablet (10 mg total) by mouth daily. 90 tablet 3   fexofenadine (ALLEGRA) 180 MG tablet Take 180 mg by mouth daily.     fluticasone (FLONASE) 50 MCG/ACT nasal spray as needed.  3   glimepiride  (AMARYL ) 4 MG tablet Take 1.5 tablets (6 mg total) by mouth daily before breakfast. 135 tablet 3   LORazepam  (ATIVAN ) 0.5 MG tablet Take one tablet four times daily for increased anxiety. 120 tablet 2   losartan (COZAAR) 50 MG tablet Take 1 tablet by mouth daily.     montelukast  (SINGULAIR ) 10 MG tablet Take 10 mg by mouth daily.     pantoprazole (PROTONIX) 40 MG tablet Take 1 tablet by mouth daily.  1   sitaGLIPtin  (JANUVIA ) 100 MG tablet Take 1 tablet (100 mg total) by mouth daily. 90 tablet 3   SUMAtriptan  (IMITREX ) 100 MG tablet Take 1 tablet (100 mg total) by mouth once as needed for up to 1 dose for migraine. May repeat in 2 hours if headache persists or recurs. 10 tablet  6   SYNTHROID  75 MCG tablet Take 1 tablet (75 mcg total) by mouth daily before breakfast. 90 tablet 3   No current facility-administered medications for this visit.    Medication Side Effects: None  Allergies:  Allergies  Allergen Reactions   Asa [Aspirin] Other (See Comments)    GERD   Aspartame And Phenylalanine     Joint aches   Chocolate Flavoring Agent (Non-Screening)     Other reaction(s): excessive belching    Codeine Hives   Dextroamphetamine Hives   Diclofenac Sodium Hives    Other reaction(s): hives   Erythromycin Hives   Metformin And Related Hives   Mint Chocolate Chip Flavoring (Non-Screening)    Other     Splenda, Stevia, Truvia   Oxycodone Hives   Oxycodone-Acetaminophen     Other reaction(s): vomiting   Penicillins Hives   Pennsaid [Diclofenac Sodium] Hives   Semaglutide Nausea And Vomiting   Empagliflozin Nausea And Vomiting    Other reaction(s): diarrhea    Past Medical History:  Diagnosis Date   Allergy    Diabetes mellitus without complication (HCC)    GERD (gastroesophageal reflux disease)    Migraine    Thyroid  disease     Family History  Problem Relation Age of Onset   Cancer Brother 70       renal cell carcinoma   Migraines Mother    Seizures Mother    Skin cancer Father    Thyroid  disease Sister    Thyroid  disease Sister    Colon cancer Neg Hx    Stomach cancer Neg Hx    Rectal cancer Neg Hx    Esophageal cancer Neg Hx    Liver cancer Neg Hx     Social History   Socioeconomic History   Marital status: Married    Spouse name: Not on file   Number of children: 0   Years of education: college   Highest education level: Not on file  Occupational History   Occupation: substitute Magazine features editor: Ashlee Dennis  Tobacco Use   Smoking status: Never   Smokeless tobacco: Never  Vaping Use   Vaping status: Never Used  Substance and Sexual Activity   Alcohol use: No    Alcohol/week: 0.0 standard drinks of alcohol   Drug use: No   Sexual activity: Yes    Partners: Male    Birth control/protection: Condom  Other Topics Concern   Not on file  Social History Narrative   Lives with her boyfriend.   Right-handed.   2-3 cups caffeine per day.   Social Drivers of Corporate investment banker Strain: Not on file  Food Insecurity: Low Risk  (11/04/2023)   Received from [facility]   Hunger Vital Sign    Worried About Running Out of Food in the Last  Year: Never true    Ran Out of Food in the Last Year: Never true  Transportation Needs: No Transportation Needs (11/04/2023)   Received from [facility]    In the past 12 months, has lack of reliable transportation kept you from medical appointments, meetings, work or from getting things needed for daily living? : No  Physical Activity: Not on file  Stress: Not on file  Social Connections: Unknown (12/08/2022)   Received from [facility]   Social Network    Social Network: Not on file  Intimate Partner Violence: Unknown (12/08/2022)   [facility], [facility]  HITS    Physically Hurt: Not on file    Insult or Talk Down To: Not on file    Threaten Physical Harm: Not on file    Scream or Curse: Not on file    Past Medical History, Surgical history, Social history, and Family history were reviewed and updated as appropriate.   Please see review of systems for further details on the patient's review from today.   Objective:   Physical Exam:  There were no vitals taken for this visit.  Physical Exam Constitutional:      General: She is not in acute distress. Musculoskeletal:        General: No deformity.  Neurological:     Mental Status: She is alert and oriented to person, place, and time.     Coordination: Coordination normal.  Psychiatric:        Attention and Perception: Attention and perception normal. She does not perceive auditory or visual hallucinations.        Mood and Affect: Mood normal. Mood is not anxious or depressed. Affect is not labile, blunt, angry or inappropriate.        Speech: Speech normal.        Behavior: Behavior normal.        Thought Content: Thought content normal. Thought content is not paranoid or delusional. Thought content does not include homicidal or suicidal ideation. Thought content does not include homicidal or suicidal plan.        Cognition and Memory: Cognition and memory normal.         Judgment: Judgment normal.     Comments: Insight intact     Lab Review:     Component Value Date/Time   NA 139 10/18/2022 0851   K 3.8 10/18/2022 0851   CL 102 10/18/2022 0851   CO2 30 10/18/2022 0851   GLUCOSE 235 (H) 10/18/2022 0851   BUN 16 10/18/2022 0851   CREATININE 0.66 10/18/2022 0851   CALCIUM 9.3 10/18/2022 0851   PROT 7.4 11/16/2017 1523   ALBUMIN 4.4 11/16/2017 1523   AST 26 11/16/2017 1523   ALT 21 11/16/2017 1523   ALKPHOS 50 11/16/2017 1523   BILITOT 0.5 11/16/2017 1523   GFRNONAA >60 11/16/2017 1523   GFRAA >60 11/16/2017 1523       Component Value Date/Time   WBC 14.6 (H) 11/16/2017 1523   RBC 5.00 11/16/2017 1523   HGB 15.0 11/16/2017 1532   HCT 44.0 11/16/2017 1532   PLT 289 11/16/2017 1523   MCV 85.8 11/16/2017 1523   MCH 29.6 11/16/2017 1523   MCHC 34.5 11/16/2017 1523   RDW 12.0 11/16/2017 1523   LYMPHSABS 1.4 11/16/2017 1523   MONOABS 0.7 11/16/2017 1523   EOSABS 0.0 11/16/2017 1523   BASOSABS 0.0 11/16/2017 1523    No results found for: "POCLITH", "LITHIUM"   No results found for: "PHENYTOIN", "PHENOBARB", "VALPROATE", "CBMZ"   .res Assessment: Plan:    Plan:  PDMP reviewed  Lorazepam  0.5mg  - taking 3 tablets daily for anxiety - has a 4th dose as needed. Lexapro  10mg    RTC 3 months  10 minutes spent dedicated to the care of this patient on the date of this encounter to include pre-visit review of records, ordering of medication, post visit documentation, and face-to-face time with the patient discussing GAD. Discussed continuing current medication regimen.  Patient advised to contact office with any questions, adverse effects, or acute worsening in signs and symptoms.    Discussed potential benefits, risk,  and side effects of benzodiazepines to include potential risk of tolerance and dependence, as well as possible drowsiness.  Advised patient not to drive if experiencing drowsiness and to take lowest possible effective dose to  minimize risk of dependence and tolerance.   There are no diagnoses linked to this encounter.   Please see After Visit Summary for patient specific instructions.  Future Appointments  Date Time Provider Department Center  02/27/2024 10:30 AM Ashlee Kurowski Nattalie, NP [facility] None  02/28/2024  9:50 AM Shamleffer, Julian Obey, MD [facility] None    No orders of the defined types were placed in this encounter.     -------------------------------

## 2024-02-28 ENCOUNTER — Encounter: Payer: Self-pay | Admitting: Internal Medicine

## 2024-02-28 ENCOUNTER — Ambulatory Visit: Payer: BC Managed Care – PPO | Admitting: Internal Medicine

## 2024-02-28 VITALS — BP 124/76 | HR 70 | Ht 64.0 in | Wt 160.2 lb

## 2024-02-28 DIAGNOSIS — E039 Hypothyroidism, unspecified: Secondary | ICD-10-CM | POA: Diagnosis not present

## 2024-02-28 DIAGNOSIS — Z7984 Long term (current) use of oral hypoglycemic drugs: Secondary | ICD-10-CM

## 2024-02-28 DIAGNOSIS — E1165 Type 2 diabetes mellitus with hyperglycemia: Secondary | ICD-10-CM

## 2024-02-28 DIAGNOSIS — E119 Type 2 diabetes mellitus without complications: Secondary | ICD-10-CM

## 2024-02-28 NOTE — Patient Instructions (Signed)

## 2024-02-28 NOTE — Progress Notes (Signed)
 Name: Ashlee Dennis  MRN/ DOB: [id number], 1971-08-30   Age/ Sex: 53 y.o., female    PCP: Merl Star, MD   Reason for Endocrinology Evaluation: Type 2 Diabetes Mellitus     Date of Initial Endocrinology Visit: 10/18/2022    PATIENT IDENTIFIER: Ashlee Dennis is a 53 y.o. female with a past medical history of DM, HTN, migraine, generalized anxiety disorder, hypothyroid and OSA. The patient presented for initial endocrinology clinic visit on 10/18/2022 for consultative assistance with her diabetes management.    HPI: Ashlee Dennis was    Diagnosed with DM  at age 77 Prior Medications tried/Intolerance: jardiance and metformin - hives , rybelsus severe vomiting  Hemoglobin A1c has ranged from 6.2% in 2012, peaking at 7.0% in 2024.   On her initial visit to our clinic she had an A1c of 7.0%, we continue Januvia  and glimepiride    THYROID  HISTORY: Patient has been on chronic LT-for replacement.  She has had chronic thyromegaly with an ultrasound in 2014 not revealing any thyroid  nodules.  Mother and two sister with thyroid  disease    SUBJECTIVE:   During the last visit (02/25/2023): A1c 7.0%  Today (02/28/24): Ashlee Dennis is here for follow-up on diabetes management and hypothyroidism.  She checks her blood sugars multiple times daily through [employer]. The patient has not had hypoglycemic episodes   Since her last visit here, the patient has established endocrinology care with Dr. Ronelle Coffee, as her spouse's follows with her as well, glimepiride  dose has been changed from 1.5 tablets before breakfast, to 1 tablet before lunch and supper  Denies local neck swelling  Denies constipation or diarrhea  Denies palpitations  Denies tremors  No biotin    HOME DIABETES REGIMEN: Glimepiride  2 mg , 1.5 tabs daily - Takes 1 lunch and 1 supper  Januvia  100 mg daily  Synthroid  75 mcg daily     Statin: No ACE-I/ARB: Yes    CONTINUOUS GLUCOSE MONITORING RECORD INTERPRETATION    Dates of  Recording: 5/7-5/20/2025  Sensor description: Freestyle libre  Results statistics:   CGM use % of time 97  Average and SD 175/19.2  Time in range  62  %  % Time Above 180 36  % Time above 250 2  % Time Below target 0   Glycemic patterns summary: BGs are optimal overnight and trend upwards during the day  Hyperglycemic episodes postprandial  Hypoglycemic episodes occurred N/A  Overnight periods: Optimal    DIABETIC COMPLICATIONS: Microvascular complications:   Denies: CKD Last eye exam: Completed 01/08/2022  Macrovascular complications:   Denies: CAD, PVD, CVA   PAST HISTORY: Past Medical History:  Past Medical History:  Diagnosis Date   Allergy    Diabetes mellitus without complication (HCC)    GERD (gastroesophageal reflux disease)    Migraine    Thyroid  disease    Past Surgical History:  Past Surgical History:  Procedure Laterality Date   ABDOMINAL HYSTERECTOMY     BREAST SURGERY     Cancer   KNEE ARTHROSCOPY     Teeth Implants     x 2    Social History:  reports that she has never smoked. She has never used smokeless tobacco. She reports that she does not drink alcohol and does not use drugs. Family History:  Family History  Problem Relation Age of Onset   Cancer Brother 56       renal cell carcinoma   Migraines Mother    Seizures Mother    Skin  cancer Father    Thyroid  disease Sister    Thyroid  disease Sister    Colon cancer Neg Hx    Stomach cancer Neg Hx    Rectal cancer Neg Hx    Esophageal cancer Neg Hx    Liver cancer Neg Hx      HOME MEDICATIONS: Allergies as of 02/28/2024       Reactions   Asa [aspirin] Other (See Comments)   GERD   Aspartame And Phenylalanine    Joint aches   Chocolate Flavoring Agent (non-screening)    Other reaction(s): excessive belching   Codeine Hives   Dextroamphetamine Hives   Diclofenac Sodium Hives   Other reaction(s): hives   Erythromycin Hives   Metformin And Related Hives   Mint Chocolate  Chip Flavoring (non-screening)    Other    Splenda, Stevia, Truvia   Oxycodone Hives   Oxycodone-acetaminophen    Other reaction(s): vomiting   Penicillins Hives   Pennsaid [diclofenac Sodium] Hives   Semaglutide Nausea And Vomiting   Empagliflozin Nausea And Vomiting   Other reaction(s): diarrhea        Medication List        Accurate as of Feb 28, 2024  9:59 AM. If you have any questions, ask your nurse or doctor.          escitalopram  20 MG tablet Commonly known as: LEXAPRO  Take 20 mg by mouth daily.   escitalopram  10 MG tablet Commonly known as: LEXAPRO  Take 1 tablet (10 mg total) by mouth daily.   fexofenadine 180 MG tablet Commonly known as: ALLEGRA Take 180 mg by mouth daily.   fluticasone 50 MCG/ACT nasal spray Commonly known as: FLONASE as needed.   FreeStyle Libre 3 Plus Sensor Misc CHANGE EVERY 15 DAYS   FreeStyle Libre Reader Devi by Does not apply route.   glimepiride  4 MG tablet Commonly known as: AMARYL  Take 1.5 tablets (6 mg total) by mouth daily before breakfast.   LORazepam  2 MG tablet Commonly known as: ATIVAN  Take 0.5 mg by mouth.   LORazepam  0.5 MG tablet Commonly known as: ATIVAN  Take one tablet four times daily for increased anxiety.   losartan 50 MG tablet Commonly known as: COZAAR Take 1 tablet by mouth daily.   montelukast  10 MG tablet Commonly known as: SINGULAIR  Take 10 mg by mouth daily.   Myrbetriq 50 MG Tb24 tablet Generic drug: mirabegron ER Take 1 tablet by mouth daily.   pantoprazole 40 MG tablet Commonly known as: PROTONIX Take 1 tablet by mouth daily.   rosuvastatin 10 MG tablet Commonly known as: CRESTOR Take 10 mg by mouth daily.   sitaGLIPtin  100 MG tablet Commonly known as: Januvia  Take 1 tablet (100 mg total) by mouth daily.   SUMAtriptan  100 MG tablet Commonly known as: IMITREX  Take 1 tablet (100 mg total) by mouth once as needed for up to 1 dose for migraine. May repeat in 2 hours if  headache persists or recurs.   Synthroid  75 MCG tablet Generic drug: levothyroxine Take 1 tablet (75 mcg total) by mouth daily before breakfast.         ALLERGIES: Allergies  Allergen Reactions   Asa [Aspirin] Other (See Comments)    GERD   Aspartame And Phenylalanine     Joint aches   Chocolate Flavoring Agent (Non-Screening)     Other reaction(s): excessive belching   Codeine Hives   Dextroamphetamine Hives   Diclofenac Sodium Hives    Other reaction(s): hives   Erythromycin  Hives   Metformin And Related Hives   Mint Chocolate Chip Flavoring (Non-Screening)    Other     Splenda, Stevia, Truvia   Oxycodone Hives   Oxycodone-Acetaminophen     Other reaction(s): vomiting   Penicillins Hives   Pennsaid [Diclofenac Sodium] Hives   Semaglutide Nausea And Vomiting   Empagliflozin Nausea And Vomiting    Other reaction(s): diarrhea     REVIEW OF SYSTEMS: A comprehensive ROS was conducted with the patient and is negative except as per HPI     OBJECTIVE:   VITAL SIGNS: BP 124/76 (BP Location: Left Arm, Patient Position: Sitting, Cuff Size: Normal)   Pulse 70   Ht 5\' 4"  (1.626 m)   Wt 160 lb 3.2 oz (72.7 kg)   SpO2 98%   BMI 27.50 kg/m    PHYSICAL EXAM:  General: Pt appears well and is in NAD  Neck: General: Supple without adenopathy or carotid bruits. Thyroid : Thyroid  size normal.  No goiter or nodules appreciated.   Lungs: Clear with good BS bilat   Heart: RRR   Extremities:  Lower extremities - No pretibial edema.  Neuro: MS is good with appropriate affect, pt is alert and Ox3    DM foot exam: 08/25/2023 per podiatry    DATA REVIEWED:  05/27/2023 BUN/Cr 12/0.660 GFR 106 LDL 89 Tg 144 TSH 0.840 A1c 7.7%   Old records , labs and images have been reviewed.    ASSESSMENT / PLAN / RECOMMENDATIONS:   1) Type 2 Diabetes Mellitus, Sub-optimally controlled, Without complications - Most recent A1c of 7.5 %. Goal A1c < 7.0 %.    -Patient had an A1c  approximately 2 months ago with an A1c of 7.5% -Glimepiride  dose has been changed by Dr. Ronelle Coffee to twice daily -Patient has been noted with postprandial hyperglycemia, she does tend to snack, we discussed the importance of avoiding or reducing carbohydrates with snacks -Patient with reported intolerance to metformin, Jardiance, and Rybelsus    MEDICATIONS: Continue   glimepiride  4 mg, 1 tablet twice daily Continue Januvia  100 mg daily  EDUCATION / INSTRUCTIONS BG monitoring instructions: Patient is instructed to check her blood sugars 3 times a day before meals Call [place] [facility] if: BG persistently < 70  I reviewed the Rule of 15 for the treatment of hypoglycemia in detail with the patient. Literature supplied.   2) Diabetic complications:  Eye: Does not have known diabetic retinopathy.  Neuro/ Feet: Does not have known diabetic peripheral neuropathy. Renal: Patient does not have known baseline CKD. She is  on an ACEI/ARB at present.  3) Hypothyroidism:  -Patient is clinically euthyroid -Previous thyroid  ultrasound did not reveal any thyroid  nodules -TSH is within normal range at Dr. Earleen Glazier office -No change at this time  Medication Continue Synthroid  75 mcg daily   Patient will continue to follow-up with Dr. Ronelle Coffee, as her spouse follows with her as well   Signed electronically by: Natale Bail, MD  [facility]  [facility] [address] [place], [place] [address] Phone: [phone] FAX: [phone]   CC: Merl Star, MD [address] [place] [address] Phone: [phone]  Fax: [phone]    Return to Endocrinology clinic as below: Future Appointments  Date Time Provider Department Center  05/29/2024  9:30 AM Mozingo, Regina Nattalie, NP [facility] None

## 2024-03-22 DIAGNOSIS — M25512 Pain in left shoulder: Secondary | ICD-10-CM | POA: Diagnosis not present

## 2024-03-26 DIAGNOSIS — M25512 Pain in left shoulder: Secondary | ICD-10-CM | POA: Diagnosis not present

## 2024-03-29 DIAGNOSIS — Z6826 Body mass index (BMI) 26.0-26.9, adult: Secondary | ICD-10-CM | POA: Diagnosis not present

## 2024-03-29 DIAGNOSIS — F419 Anxiety disorder, unspecified: Secondary | ICD-10-CM | POA: Diagnosis not present

## 2024-03-29 DIAGNOSIS — E1169 Type 2 diabetes mellitus with other specified complication: Secondary | ICD-10-CM | POA: Diagnosis not present

## 2024-03-29 DIAGNOSIS — E663 Overweight: Secondary | ICD-10-CM | POA: Diagnosis not present

## 2024-04-04 DIAGNOSIS — M25512 Pain in left shoulder: Secondary | ICD-10-CM | POA: Diagnosis not present

## 2024-04-05 DIAGNOSIS — E1165 Type 2 diabetes mellitus with hyperglycemia: Secondary | ICD-10-CM | POA: Diagnosis not present

## 2024-04-05 DIAGNOSIS — E7800 Pure hypercholesterolemia, unspecified: Secondary | ICD-10-CM | POA: Diagnosis not present

## 2024-04-05 DIAGNOSIS — E039 Hypothyroidism, unspecified: Secondary | ICD-10-CM | POA: Diagnosis not present

## 2024-04-11 DIAGNOSIS — M25512 Pain in left shoulder: Secondary | ICD-10-CM | POA: Diagnosis not present

## 2024-04-12 DIAGNOSIS — E1165 Type 2 diabetes mellitus with hyperglycemia: Secondary | ICD-10-CM | POA: Diagnosis not present

## 2024-04-12 DIAGNOSIS — E7800 Pure hypercholesterolemia, unspecified: Secondary | ICD-10-CM | POA: Diagnosis not present

## 2024-04-12 DIAGNOSIS — E039 Hypothyroidism, unspecified: Secondary | ICD-10-CM | POA: Diagnosis not present

## 2024-04-12 DIAGNOSIS — I10 Essential (primary) hypertension: Secondary | ICD-10-CM | POA: Diagnosis not present

## 2024-04-18 DIAGNOSIS — M25512 Pain in left shoulder: Secondary | ICD-10-CM | POA: Diagnosis not present

## 2024-04-25 DIAGNOSIS — M25512 Pain in left shoulder: Secondary | ICD-10-CM | POA: Diagnosis not present

## 2024-05-02 DIAGNOSIS — M25512 Pain in left shoulder: Secondary | ICD-10-CM | POA: Diagnosis not present

## 2024-05-02 DIAGNOSIS — J310 Chronic rhinitis: Secondary | ICD-10-CM | POA: Diagnosis not present

## 2024-05-10 DIAGNOSIS — M25512 Pain in left shoulder: Secondary | ICD-10-CM | POA: Diagnosis not present

## 2024-05-11 DIAGNOSIS — Q8789 Other specified congenital malformation syndromes, not elsewhere classified: Secondary | ICD-10-CM | POA: Diagnosis not present

## 2024-05-11 DIAGNOSIS — H903 Sensorineural hearing loss, bilateral: Secondary | ICD-10-CM | POA: Diagnosis not present

## 2024-05-21 DIAGNOSIS — Z01419 Encounter for gynecological examination (general) (routine) without abnormal findings: Secondary | ICD-10-CM | POA: Diagnosis not present

## 2024-05-23 DIAGNOSIS — M25512 Pain in left shoulder: Secondary | ICD-10-CM | POA: Diagnosis not present

## 2024-05-29 ENCOUNTER — Ambulatory Visit: Admitting: Adult Health

## 2024-05-29 ENCOUNTER — Encounter: Payer: Self-pay | Admitting: Adult Health

## 2024-05-29 DIAGNOSIS — F411 Generalized anxiety disorder: Secondary | ICD-10-CM

## 2024-05-29 MED ORDER — LORAZEPAM 0.5 MG PO TABS: ORAL_TABLET | ORAL | 2 refills | Status: DC

## 2024-05-29 NOTE — Progress Notes (Signed)
 Ashlee Dennis [id number] [birth year] 53 y.o.  Subjective:   Patient ID:  Ashlee Dennis is a 53 y.o. (DOB Jan 10, 1971) female.  Chief Complaint: No chief complaint on file.   HPI Ashlee Dennis presents to the office today for follow-up of GAD.  Describes mood today as ok. Pleasant. Decreased tearfulness. Mood symptoms - denies depression, anxiety and irritability. Reports stable interest and motivation. Denies panic attacks. Denies worry, rumination and over thinking. Reports mood is stable. Stating I feel like I'm doing ok. Taking medications as prescribed.  Energy levels stable. Active, has a regular exercise routine. Enjoys some usual interests and activities. Married. Lives with husband - 2 cats. Family local. Spending time with family. Appetite adequate. Weight loss - 158 from 164 pounds. Sleeps well most nights. Averages 6 to 7 hours. Focus and concentration stable. 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 Completing tasks. Managing aspects of household. 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  Denies SI or HI.  Denies AH or VH. Denies self harm. Denies substance use.  Previous medication trials:  Lorazepam , Lexapro     PHQ2-9    Flowsheet Row Office Visit from 02/25/2017 in Primary Care at [facility] Visit from 05/05/2016 in Primary Care at [facility] Visit from 08/19/2015 in Primary Care at [facility] Total Score 0 0 0     Review of Systems:  Review of Systems  Musculoskeletal:  Negative for gait problem.  Neurological:  Negative for tremors.  Psychiatric/Behavioral:         Please refer to HPI    Medications: I have reviewed the patient's current medications.  Current Outpatient Medications  Medication Sig Dispense Refill   Continuous Glucose Receiver (FREESTYLE LIBRE READER) DEVI by Does not apply route.     Continuous Glucose Sensor (FREESTYLE LIBRE 3 PLUS SENSOR) MISC CHANGE EVERY 15 DAYS     escitalopram  (LEXAPRO ) 10 MG tablet Take 1 tablet (10 mg total) by mouth daily. 90 tablet 3    escitalopram  (LEXAPRO ) 20 MG tablet Take 20 mg by mouth daily.     fexofenadine (ALLEGRA) 180 MG tablet Take 180 mg by mouth daily.     fluticasone (FLONASE) 50 MCG/ACT nasal spray as needed.  3   glimepiride  (AMARYL ) 4 MG tablet Take 1.5 tablets (6 mg total) by mouth daily before breakfast. 135 tablet 3   LORazepam  (ATIVAN ) 0.5 MG tablet Take one tablet four times daily for increased anxiety. 120 tablet 2   LORazepam  (ATIVAN ) 2 MG tablet Take 0.5 mg by mouth. (Patient not taking: Reported on 02/28/2024)     losartan (COZAAR) 50 MG tablet Take 1 tablet by mouth daily.     mirabegron ER (MYRBETRIQ) 50 MG TB24 tablet Take 1 tablet by mouth daily.     montelukast  (SINGULAIR ) 10 MG tablet Take 10 mg by mouth daily.     pantoprazole (PROTONIX) 40 MG tablet Take 1 tablet by mouth daily.  1   rosuvastatin (CRESTOR) 10 MG tablet Take 10 mg by mouth daily.     sitaGLIPtin  (JANUVIA ) 100 MG tablet Take 1 tablet (100 mg total) by mouth daily. 90 tablet 3   SUMAtriptan  (IMITREX ) 100 MG tablet Take 1 tablet (100 mg total) by mouth once as needed for up to 1 dose for migraine. May repeat in 2 hours if headache persists or recurs. 10 tablet 6   SYNTHROID  75 MCG tablet Take 1 tablet (75 mcg total) by mouth daily before breakfast. 90 tablet 3   No current facility-administered medications for this visit.  Medication Side Effects: None  Allergies:  Allergies  Allergen Reactions   Asa [Aspirin] Other (See Comments)    GERD   Aspartame And Phenylalanine     Joint aches   Chocolate Flavoring Agent (Non-Screening)     Other reaction(s): excessive belching   Codeine Hives   Dextroamphetamine Hives   Diclofenac Sodium Hives    Other reaction(s): hives   Erythromycin Hives   Metformin And Related Hives   Mint Chocolate Chip Flavoring (Non-Screening)    Other     Splenda, Stevia, Truvia   Oxycodone Hives   Oxycodone-Acetaminophen     Other reaction(s): vomiting   Penicillins Hives   Pennsaid  [Diclofenac Sodium] Hives   Semaglutide Nausea And Vomiting   Empagliflozin Nausea And Vomiting    Other reaction(s): diarrhea    Past Medical History:  Diagnosis Date   Allergy    Diabetes mellitus without complication (HCC)    GERD (gastroesophageal reflux disease)    Migraine    Thyroid  disease     Past Medical History, Surgical history, Social history, and Family history were reviewed and updated as appropriate.   Please see review of systems for further details on the patient's review from today.   Objective:   Physical Exam:  There were no vitals taken for this visit.  Physical Exam Constitutional:      General: She is not in acute distress. Musculoskeletal:        General: No deformity.  Neurological:     Mental Status: She is alert and oriented to person, place, and time.     Coordination: Coordination normal.  Psychiatric:        Attention and Perception: Attention and perception normal. She does not perceive auditory or visual hallucinations.        Mood and Affect: Mood normal. Mood is not anxious or depressed. Affect is not labile, blunt, angry or inappropriate.        Speech: Speech normal.        Behavior: Behavior normal.        Thought Content: Thought content normal. Thought content is not paranoid or delusional. Thought content does not include homicidal or suicidal ideation. Thought content does not include homicidal or suicidal plan.        Cognition and Memory: Cognition and memory normal.        Judgment: Judgment normal.     Comments: Insight intact     Lab Review:     Component Value Date/Time   NA 139 10/18/2022 0851   K 3.8 10/18/2022 0851   CL 102 10/18/2022 0851   CO2 30 10/18/2022 0851   GLUCOSE 235 (H) 10/18/2022 0851   BUN 16 10/18/2022 0851   CREATININE 0.66 10/18/2022 0851   CALCIUM 9.3 10/18/2022 0851   PROT 7.4 11/16/2017 1523   ALBUMIN 4.4 11/16/2017 1523   AST 26 11/16/2017 1523   ALT 21 11/16/2017 1523   ALKPHOS 50  11/16/2017 1523   BILITOT 0.5 11/16/2017 1523   GFRNONAA >60 11/16/2017 1523   GFRAA >60 11/16/2017 1523       Component Value Date/Time   WBC 14.6 (H) 11/16/2017 1523   RBC 5.00 11/16/2017 1523   HGB 15.0 11/16/2017 1532   HCT 44.0 11/16/2017 1532   PLT 289 11/16/2017 1523   MCV 85.8 11/16/2017 1523   MCH 29.6 11/16/2017 1523   MCHC 34.5 11/16/2017 1523   RDW 12.0 11/16/2017 1523   LYMPHSABS 1.4 11/16/2017 1523   MONOABS 0.7  11/16/2017 1523   EOSABS 0.0 11/16/2017 1523   BASOSABS 0.0 11/16/2017 1523    No results found for: POCLITH, LITHIUM   No results found for: PHENYTOIN, PHENOBARB, VALPROATE, CBMZ   .res Assessment: Plan:    Plan:  PDMP reviewed  Lorazepam  0.5mg  - taking 3 tablets daily for anxiety - has a 4th dose as needed. Lexapro  10mg    RTC 3 months  15 minutes spent dedicated to the care of this patient on the date of this encounter to include pre-visit review of records, ordering of medication, post visit documentation, and face-to-face time with the patient discussing GAD. Discussed continuing current medication regimen.  Patient advised to contact office with any questions, adverse effects, or acute worsening in signs and symptoms.    Discussed potential benefits, risk, and side effects of benzodiazepines to include potential risk of tolerance and dependence, as well as possible drowsiness. Advised patient not to drive if experiencing drowsiness and to take lowest possible effective dose to minimize risk of dependence and tolerance.   There are no diagnoses linked to this encounter.   Please see After Visit Summary for patient specific instructions.  Future Appointments  Date Time Provider Department Center  05/29/2024  9:30 AM Kareemah Grounds Nattalie, NP [facility] None    No orders of the defined types were placed in this encounter.   -------------------------------

## 2024-06-05 DIAGNOSIS — E1169 Type 2 diabetes mellitus with other specified complication: Secondary | ICD-10-CM | POA: Diagnosis not present

## 2024-06-05 DIAGNOSIS — M25512 Pain in left shoulder: Secondary | ICD-10-CM | POA: Diagnosis not present

## 2024-06-05 DIAGNOSIS — E663 Overweight: Secondary | ICD-10-CM | POA: Diagnosis not present

## 2024-06-05 DIAGNOSIS — Z6826 Body mass index (BMI) 26.0-26.9, adult: Secondary | ICD-10-CM | POA: Diagnosis not present

## 2024-06-05 DIAGNOSIS — F419 Anxiety disorder, unspecified: Secondary | ICD-10-CM | POA: Diagnosis not present

## 2024-06-13 DIAGNOSIS — H0102B Squamous blepharitis left eye, upper and lower eyelids: Secondary | ICD-10-CM | POA: Diagnosis not present

## 2024-06-13 DIAGNOSIS — H2513 Age-related nuclear cataract, bilateral: Secondary | ICD-10-CM | POA: Diagnosis not present

## 2024-06-13 DIAGNOSIS — H0102A Squamous blepharitis right eye, upper and lower eyelids: Secondary | ICD-10-CM | POA: Diagnosis not present

## 2024-06-13 DIAGNOSIS — E119 Type 2 diabetes mellitus without complications: Secondary | ICD-10-CM | POA: Diagnosis not present

## 2024-06-15 ENCOUNTER — Ambulatory Visit (INDEPENDENT_AMBULATORY_CARE_PROVIDER_SITE_OTHER): Admitting: Psychiatry

## 2024-06-15 DIAGNOSIS — F411 Generalized anxiety disorder: Secondary | ICD-10-CM | POA: Diagnosis not present

## 2024-06-15 NOTE — Progress Notes (Signed)
 [facility] Counselor/Therapist Progress Note  Patient ID: Ashlee Dennis, MRN: [id number],    Date: 06/15/2024  Time Spent: 55 minutes   Treatment Type: Individual Therapy  Reported Symptoms: anxiety, sadness re: family dog    Mental Status Exam:  Appearance:   Casual     Behavior:  Appropriate, Sharing, and Motivated  Motor:  Normal  Speech/Language:   Clear and Coherent  Affect:  anxious  Mood:  anxious and sad  Thought process:  goal directed  Thought content:    Rumination  Sensory/Perceptual disturbances:    WNL  Orientation:  oriented to person, place, time/date, situation, day of week, month of year, year, and stated date of Sept. 5, 2025  Attention:  Good  Concentration:  Good  Memory:  WNL  Fund of knowledge:   Good  Insight:    Good  Judgment:   Good  Impulse Control:  Good   Risk Assessment: Danger to Self:  No Self-injurious Behavior: No Danger to Others: No Duty to Warn:no Physical Aggression / Violence:No  Access to Firearms a concern: No  Gang Involvement:No   Subjective:  Patient today working in session on her anxiety and the stress of being 24/7 the next few weeks the caretaker for her husband who has diabetes, prostate cancer, kidney cancer, skin cancer, and cirrhosis of the liven. His regular caretaker is away on vacation for 5 wks. Shared and processed issues and differences between patient and daughter, which is sad for patient but realizing she cannot make adult daughter be more responsible and caring of others. Shares examples of where patient  has had struggles with daughter (communication, unhealthy relationships) and discussed strategies that may be helpful for her moving forward. Patient feels she has not done well in setting limits with daughter and talked more today about possibilities/options . Also some grieving issues related to elderly family dog.  Re: extended family--patient is leaning towards making healthier boundaries and other  decisions going forward.  Interventions: Cognitive Behavioral Therapy, Solution-Oriented/Positive Psychology, and Ego-Supportive Long-term goal: Reduce overall level, frequency, and intensity of the anxiety so that daily functioning is not impaired. Short-term goal: Describe current and past experiences with specific fears, prominent worries, and anxiety symptoms including their impact on functioning and attempts to resolve it.  Strategies: Identify, challenge, and replace fearful/anxious self-talk with positive, realistic, and empowering self-talk   Diagnosis:   ICD-10-CM   1. Generalized anxiety disorder  F41.1      Plan: Patient did well and working in session today particularly on her anxiety and stress with her husband's continued health issues including those stated above and also the fact that she is caring for him 24/7 over these next few weeks because his regular caretaker is on vacation out of the country for the next 5 weeks.  Patient able to consider some options for other people that can help give her breaks at times which will be helpful.  Also considering how she can have more realistic expectations of herself and let go of some other things while she is providing this more concentrated care for her husband.  Good motivation and interaction today as she worked with her goal-directed behaviors as noted above.  Overall, she has made progress and continues working with her goals to move in a healthier, more positive, and hopeful direction for her.  Goal review and progress/challenges noted with patient.  Next appointment within 4 weeks.   Barnie Bunde, LCSW

## 2024-06-18 DIAGNOSIS — M25512 Pain in left shoulder: Secondary | ICD-10-CM | POA: Diagnosis not present

## 2024-06-25 DIAGNOSIS — D2361 Other benign neoplasm of skin of right upper limb, including shoulder: Secondary | ICD-10-CM | POA: Diagnosis not present

## 2024-06-25 DIAGNOSIS — L918 Other hypertrophic disorders of the skin: Secondary | ICD-10-CM | POA: Diagnosis not present

## 2024-06-25 DIAGNOSIS — D2262 Melanocytic nevi of left upper limb, including shoulder: Secondary | ICD-10-CM | POA: Diagnosis not present

## 2024-06-25 DIAGNOSIS — L814 Other melanin hyperpigmentation: Secondary | ICD-10-CM | POA: Diagnosis not present

## 2024-06-26 DIAGNOSIS — M25512 Pain in left shoulder: Secondary | ICD-10-CM | POA: Diagnosis not present

## 2024-07-05 DIAGNOSIS — Z23 Encounter for immunization: Secondary | ICD-10-CM | POA: Diagnosis not present

## 2024-07-19 ENCOUNTER — Other Ambulatory Visit (HOSPITAL_BASED_OUTPATIENT_CLINIC_OR_DEPARTMENT_OTHER): Payer: Self-pay | Admitting: Internal Medicine

## 2024-07-19 DIAGNOSIS — E7800 Pure hypercholesterolemia, unspecified: Secondary | ICD-10-CM

## 2024-08-07 DIAGNOSIS — F419 Anxiety disorder, unspecified: Secondary | ICD-10-CM | POA: Diagnosis not present

## 2024-08-07 DIAGNOSIS — E1169 Type 2 diabetes mellitus with other specified complication: Secondary | ICD-10-CM | POA: Diagnosis not present

## 2024-08-08 DIAGNOSIS — H9190 Unspecified hearing loss, unspecified ear: Secondary | ICD-10-CM | POA: Diagnosis not present

## 2024-08-08 DIAGNOSIS — E039 Hypothyroidism, unspecified: Secondary | ICD-10-CM | POA: Diagnosis not present

## 2024-08-08 DIAGNOSIS — E7800 Pure hypercholesterolemia, unspecified: Secondary | ICD-10-CM | POA: Diagnosis not present

## 2024-08-08 DIAGNOSIS — Z0000 Encounter for general adult medical examination without abnormal findings: Secondary | ICD-10-CM | POA: Diagnosis not present

## 2024-08-08 DIAGNOSIS — F419 Anxiety disorder, unspecified: Secondary | ICD-10-CM | POA: Diagnosis not present

## 2024-08-08 DIAGNOSIS — E1169 Type 2 diabetes mellitus with other specified complication: Secondary | ICD-10-CM | POA: Diagnosis not present

## 2024-08-09 ENCOUNTER — Ambulatory Visit (HOSPITAL_BASED_OUTPATIENT_CLINIC_OR_DEPARTMENT_OTHER)
Admission: RE | Admit: 2024-08-09 | Discharge: 2024-08-09 | Disposition: A | Discharge status: Home / Self Care | Payer: Self-pay | Source: Ambulatory Visit | Attending: Internal Medicine | Admitting: Internal Medicine

## 2024-08-09 DIAGNOSIS — E7800 Pure hypercholesterolemia, unspecified: Secondary | ICD-10-CM | POA: Insufficient documentation

## 2024-08-09 DIAGNOSIS — Z1211 Encounter for screening for malignant neoplasm of colon: Secondary | ICD-10-CM | POA: Diagnosis not present

## 2024-08-15 ENCOUNTER — Ambulatory Visit: Admitting: Psychiatry

## 2024-08-15 DIAGNOSIS — F411 Generalized anxiety disorder: Secondary | ICD-10-CM

## 2024-08-15 NOTE — Progress Notes (Signed)
 [facility] Counselor/Therapist Progress Note  Patient ID: Ashlee Dennis, MRN: [id number],    Date: 08/15/2024  Time Spent: 50 minutes   Treatment Type: Individual Therapy  Reported Symptoms: anxiety   Mental Status Exam:  Appearance:   Casual     Behavior:  Appropriate, Sharing, and Motivated  Motor:  Normal  Speech/Language:   Clear and Coherent  Affect:  Depressed and some anxiety  Mood:  anxious and depressed  Thought process:  goal directed  Thought content:    WNL  Sensory/Perceptual disturbances:    WNL  Orientation:  oriented to person, place, time/date, situation, day of week, month of year, year, and stated date of Nov. 5, 2025  Attention:  Fair  Concentration:  Fair  Memory:  WNL  Fund of knowledge:   Good  Insight:    Good  Judgment:   Good  Impulse Control:  Good   Risk Assessment: Danger to Self:  No Self-injurious Behavior: No Danger to Others: No Duty to Warn:no Physical Aggression / Violence:No  Access to Firearms a concern: No  Gang Involvement:No   Subjective:   Patient in session today and reporting anxiety, stress re: death of friend and won't be here for the funeral as she and husband already had trip planned for that same time.  Needing session time to unload and discuss her caretaking for her husband while his normal caretaker will be away for several weeks out of the country, and patient will be the primary caretaker.  Also during this time, she is helping check in with 9 year old nephew who has emotional/mental issues, helping him around his home. Today very focused on this nephew and trying to help him, even though he sometimes is accepting of help and sometimes sabotages her efforts which adds to patient's frustration.  It does seem that she manages this as well as possible however, and is able to set limits and involve herself in outside activities that she enjoys.  Patient's husband continues to struggle with diabetes, prostate cancer, skin  cancer, kidney cancer, and cirrhosis of the liver.  He is however able to travel some and patient plans to take him to the beach for short trip and back during the time his caregiver is gone.  Patient feels she is progressing after the death of her dog but obviously still misses him.  She continues trying to have healthy boundaries particularly with others within the family and extended family.   Interventions: Cognitive Behavioral Therapy, Solution-Oriented/Positive Psychology, and Ego-Supportive Long-term goal: Reduce overall level, frequency, and intensity of the anxiety so that daily functioning is not impaired. Short-term goal: Describe current and past experiences with specific fears, prominent worries, and anxiety symptoms including their impact on functioning and attempts to resolve it.  Strategies: Identify, challenge, and replace fearful/anxious self-talk with positive, realistic, and empowering self-talk    Diagnosis:   ICD-10-CM   1. Generalized anxiety disorder  F41.1      Plan: Patient today working in session primarily trying to sort through her stressors regarding husband's continued health issues and his caregiver being out of the states for several weeks, and also trying to help her adult emotionally/mentally challenged nephew as noted above.  Definitely encouraged her to be able to take at least some brief breaks at times and she is already keeping that in mind.  She also is aware that there are some other people that can help out with her responsibilities and give her a break at times  as needed.  Realizes the need for her to have healthier and more realistic expectations of herself including being able to let go of some things out of her control, and being able to ask for help when needed.  Good motivation and problem solving thought processes today in session, with some of the problem-solving aimed at alleviating patient's anxiety about these next few weeks and caring for her  husband as she finds it hard not to have a lot of free time for herself.  With some of the activities that she has planned for she and husband, she does feel that will help rather than just trying to stay at the home, and knowing that having some diversion will likely help both of them.  (Not all details included in this note due to patient privacy needs).  Patient was able to note some progress she has made and continues to work with her goals trying to move in a healthier, more positive and hopeful direction into the future.  Goal review and progress/challenges noted with patient.  Next appointment within 6 weeks.   Ashlee Bunde, LCSW

## 2024-08-20 DIAGNOSIS — E7800 Pure hypercholesterolemia, unspecified: Secondary | ICD-10-CM | POA: Diagnosis not present

## 2024-08-20 DIAGNOSIS — E1165 Type 2 diabetes mellitus with hyperglycemia: Secondary | ICD-10-CM | POA: Diagnosis not present

## 2024-08-20 DIAGNOSIS — I10 Essential (primary) hypertension: Secondary | ICD-10-CM | POA: Diagnosis not present

## 2024-08-20 DIAGNOSIS — E039 Hypothyroidism, unspecified: Secondary | ICD-10-CM | POA: Diagnosis not present

## 2024-08-28 ENCOUNTER — Encounter: Payer: Self-pay | Admitting: Adult Health

## 2024-08-28 ENCOUNTER — Ambulatory Visit: Admitting: Adult Health

## 2024-08-28 DIAGNOSIS — F411 Generalized anxiety disorder: Secondary | ICD-10-CM | POA: Diagnosis not present

## 2024-08-28 MED ORDER — LORAZEPAM 0.5 MG PO TABS: ORAL_TABLET | ORAL | 2 refills | Status: AC

## 2024-08-28 MED ORDER — ESCITALOPRAM OXALATE 10 MG PO TABS: 10.0000 mg | ORAL_TABLET | Freq: Every day | ORAL | 3 refills | Status: AC

## 2024-08-28 NOTE — Progress Notes (Signed)
 Lauralynn Dennis [id number] 23-Nov-1970 53 y.o.  Subjective:   Patient ID:  Ashlee Dennis is a 53 y.o. (DOB 13-Jun-1971) female.  Chief Complaint: No chief complaint on file.   HPI Analyce Linebaugh presents to the office today for follow-up of GAD.  Describes mood today as ok. Pleasant. Decreased tearfulness. Mood symptoms - denies depression, anxiety and irritability. Reports stable interest and motivation. Denies panic attacks. Denies worry, rumination and over thinking. Reports mood is stable. Stating I feel like I'm doing alright. Taking medications as prescribed.  Energy levels stable. Active, has a regular exercise routine. Enjoys some usual interests and activities. Married. Lives with husband - 2 cats. Family local. Spending time with family. Appetite adequate. Weight loss - 158 pounds. Sleeps well most nights. Averages 6 to 7 hours. Focus and concentration stable. 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 Completing tasks. Managing aspects of household. 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  Denies SI or HI.  Denies AH or VH. Denies self harm. Denies substance use.  Previous medication trials:  Lorazepam , Lexapro    PHQ2-9    Flowsheet Row Office Visit from 02/25/2017 in Primary Care at [facility] Visit from 05/05/2016 in Primary Care at [facility] Visit from 08/19/2015 in Primary Care at [facility] Total Score 0 0 0    Review of Systems:  Review of Systems  Musculoskeletal:  Negative for gait problem.  Neurological:  Negative for tremors.  Psychiatric/Behavioral:         Please refer to HPI    Medications: I have reviewed the patient's current medications.  Current Outpatient Medications  Medication Sig Dispense Refill   Continuous Glucose Receiver (FREESTYLE LIBRE READER) DEVI by Does not apply route.     Continuous Glucose Sensor (FREESTYLE LIBRE 3 PLUS SENSOR) MISC CHANGE EVERY 15 DAYS     escitalopram  (LEXAPRO ) 10 MG tablet Take 1 tablet (10 mg total) by mouth daily. 90 tablet 3    fexofenadine (ALLEGRA) 180 MG tablet Take 180 mg by mouth daily.     fluticasone (FLONASE) 50 MCG/ACT nasal spray as needed.  3   glimepiride  (AMARYL ) 4 MG tablet Take 1.5 tablets (6 mg total) by mouth daily before breakfast. 135 tablet 3   LORazepam  (ATIVAN ) 0.5 MG tablet Take one tablet four times daily for increased anxiety. 120 tablet 2   LORazepam  (ATIVAN ) 2 MG tablet Take 0.5 mg by mouth. (Patient not taking: Reported on 02/28/2024)     losartan (COZAAR) 50 MG tablet Take 1 tablet by mouth daily.     mirabegron ER (MYRBETRIQ) 50 MG TB24 tablet Take 1 tablet by mouth daily.     montelukast  (SINGULAIR ) 10 MG tablet Take 10 mg by mouth daily.     pantoprazole (PROTONIX) 40 MG tablet Take 1 tablet by mouth daily.  1   rosuvastatin (CRESTOR) 10 MG tablet Take 10 mg by mouth daily.     sitaGLIPtin  (JANUVIA ) 100 MG tablet Take 1 tablet (100 mg total) by mouth daily. 90 tablet 3   SUMAtriptan  (IMITREX ) 100 MG tablet Take 1 tablet (100 mg total) by mouth once as needed for up to 1 dose for migraine. May repeat in 2 hours if headache persists or recurs. 10 tablet 6   SYNTHROID  75 MCG tablet Take 1 tablet (75 mcg total) by mouth daily before breakfast. 90 tablet 3   No current facility-administered medications for this visit.    Medication Side Effects: None  Allergies:  Allergies  Allergen Reactions   Asa [Aspirin] Other (See  Comments)    GERD   Aspartame And Phenylalanine     Joint aches   Chocolate Flavoring Agent (Non-Screening)     Other reaction(s): excessive belching   Codeine Hives   Dextroamphetamine Hives   Diclofenac Sodium Hives    Other reaction(s): hives   Erythromycin Hives   Metformin And Related Hives   Mint Chocolate Chip Flavoring (Non-Screening)    Other     Splenda, Stevia, Truvia   Oxycodone Hives   Oxycodone-Acetaminophen     Other reaction(s): vomiting   Penicillins Hives   Pennsaid [Diclofenac Sodium] Hives   Semaglutide Nausea And Vomiting    Empagliflozin Nausea And Vomiting    Other reaction(s): diarrhea    Past Medical History:  Diagnosis Date   Allergy    Diabetes mellitus without complication (HCC)    GERD (gastroesophageal reflux disease)    Migraine    Thyroid  disease     Past Medical History, Surgical history, Social history, and Family history were reviewed and updated as appropriate.   Please see review of systems for further details on the patient's review from today.   Objective:   Physical Exam:  There were no vitals taken for this visit.  Physical Exam Constitutional:      General: She is not in acute distress. Musculoskeletal:        General: No deformity.  Neurological:     Mental Status: She is alert and oriented to person, place, and time.     Coordination: Coordination normal.  Psychiatric:        Attention and Perception: Attention and perception normal. She does not perceive auditory or visual hallucinations.        Mood and Affect: Mood normal. Mood is not anxious or depressed. Affect is not labile, blunt, angry or inappropriate.        Speech: Speech normal.        Behavior: Behavior normal.        Thought Content: Thought content normal. Thought content is not paranoid or delusional. Thought content does not include homicidal or suicidal ideation. Thought content does not include homicidal or suicidal plan.        Cognition and Memory: Cognition and memory normal.        Judgment: Judgment normal.     Comments: Insight intact     Lab Review:     Component Value Date/Time   NA 139 10/18/2022 0851   K 3.8 10/18/2022 0851   CL 102 10/18/2022 0851   CO2 30 10/18/2022 0851   GLUCOSE 235 (H) 10/18/2022 0851   BUN 16 10/18/2022 0851   CREATININE 0.66 10/18/2022 0851   CALCIUM 9.3 10/18/2022 0851   PROT 7.4 11/16/2017 1523   ALBUMIN 4.4 11/16/2017 1523   AST 26 11/16/2017 1523   ALT 21 11/16/2017 1523   ALKPHOS 50 11/16/2017 1523   BILITOT 0.5 11/16/2017 1523   GFRNONAA >60  11/16/2017 1523   GFRAA >60 11/16/2017 1523       Component Value Date/Time   WBC 14.6 (H) 11/16/2017 1523   RBC 5.00 11/16/2017 1523   HGB 15.0 11/16/2017 1532   HCT 44.0 11/16/2017 1532   PLT 289 11/16/2017 1523   MCV 85.8 11/16/2017 1523   MCH 29.6 11/16/2017 1523   MCHC 34.5 11/16/2017 1523   RDW 12.0 11/16/2017 1523   LYMPHSABS 1.4 11/16/2017 1523   MONOABS 0.7 11/16/2017 1523   EOSABS 0.0 11/16/2017 1523   BASOSABS 0.0 11/16/2017 1523  No results found for: POCLITH, LITHIUM   No results found for: PHENYTOIN, PHENOBARB, VALPROATE, CBMZ   .res Assessment: Plan:    Plan:  PDMP reviewed  Lorazepam  0.5mg  - taking 3 tablets daily for anxiety - has a 4th dose as needed. Lexapro  10mg    RTC 3 months  20 minutes spent dedicated to the care of this patient on the date of this encounter to include pre-visit review of records, ordering of medication, post visit documentation, and face-to-face time with the patient discussing GAD. Discussed continuing current medication regimen.  Patient advised to contact office with any questions, adverse effects, or acute worsening in signs and symptoms.    Discussed potential benefits, risk, and side effects of benzodiazepines to include potential risk of tolerance and dependence, as well as possible drowsiness. Advised patient not to drive if experiencing drowsiness and to take lowest possible effective dose to minimize risk of dependence and tolerance.   Diagnoses and all orders for this visit:  Generalized anxiety disorder     Please see After Visit Summary for patient specific instructions.  Future Appointments  Date Time Provider Department Center  08/28/2024 10:00 AM Alwin Lanigan, Angeline Mattocks, NP [facility] None  11/12/2024 11:00 AM Sherlynn Sober, LCSW [facility] None    No orders of the defined types were placed in this encounter.   -------------------------------

## 2024-10-08 ENCOUNTER — Other Ambulatory Visit: Payer: Self-pay | Admitting: Internal Medicine

## 2024-11-12 ENCOUNTER — Ambulatory Visit: Admitting: Psychiatry

## 2024-11-12 DIAGNOSIS — F411 Generalized anxiety disorder: Secondary | ICD-10-CM

## 2024-11-15 ENCOUNTER — Other Ambulatory Visit: Payer: Self-pay | Admitting: Internal Medicine

## 2024-11-28 ENCOUNTER — Ambulatory Visit: Admitting: Adult Health

## 2024-12-31 ENCOUNTER — Ambulatory Visit: Admitting: Psychiatry
